# Patient Record
Sex: Female | Born: 1985 | Race: Black or African American | Hispanic: No | Marital: Single | Smoking: Current every day smoker
Health system: Southern US, Community
[De-identification: ages and names within clinical notes are randomized; demographics above are authoritative.]

## PROBLEM LIST (undated history)

## (undated) DIAGNOSIS — N289 Disorder of kidney and ureter, unspecified: Secondary | ICD-10-CM

## (undated) DIAGNOSIS — F419 Anxiety disorder, unspecified: Secondary | ICD-10-CM

## (undated) DIAGNOSIS — J45909 Unspecified asthma, uncomplicated: Secondary | ICD-10-CM

## (undated) DIAGNOSIS — A6009 Herpesviral infection of other urogenital tract: Secondary | ICD-10-CM

## (undated) DIAGNOSIS — N390 Urinary tract infection, site not specified: Secondary | ICD-10-CM

## (undated) DIAGNOSIS — F32A Depression, unspecified: Secondary | ICD-10-CM

## (undated) HISTORY — DX: Depression, unspecified: F32.A

## (undated) HISTORY — DX: Herpesviral infection of other urogenital tract: A60.09

## (undated) HISTORY — DX: Anxiety disorder, unspecified: F41.9

## (undated) HISTORY — PX: WISDOM TOOTH EXTRACTION: SHX21

---

## 2004-07-22 ENCOUNTER — Emergency Department: Payer: Self-pay | Admitting: Unknown Physician Specialty

## 2005-09-06 ENCOUNTER — Emergency Department: Payer: Self-pay | Admitting: Emergency Medicine

## 2006-09-23 ENCOUNTER — Emergency Department: Payer: Self-pay | Admitting: Emergency Medicine

## 2006-11-11 ENCOUNTER — Emergency Department: Payer: Self-pay | Admitting: Emergency Medicine

## 2006-11-11 ENCOUNTER — Other Ambulatory Visit: Payer: Self-pay

## 2006-11-22 ENCOUNTER — Emergency Department: Payer: Self-pay | Admitting: Emergency Medicine

## 2007-08-13 ENCOUNTER — Emergency Department: Payer: Self-pay | Admitting: Emergency Medicine

## 2008-10-09 ENCOUNTER — Emergency Department: Payer: Self-pay | Admitting: Emergency Medicine

## 2008-12-12 ENCOUNTER — Emergency Department: Payer: Self-pay | Admitting: Emergency Medicine

## 2008-12-16 ENCOUNTER — Emergency Department: Payer: Self-pay | Admitting: Emergency Medicine

## 2009-01-09 ENCOUNTER — Emergency Department: Payer: Self-pay | Admitting: Emergency Medicine

## 2009-03-04 ENCOUNTER — Emergency Department: Payer: Self-pay | Admitting: Emergency Medicine

## 2009-03-06 ENCOUNTER — Emergency Department: Payer: Self-pay | Admitting: Internal Medicine

## 2010-05-31 ENCOUNTER — Emergency Department: Payer: Self-pay | Admitting: Emergency Medicine

## 2011-01-11 ENCOUNTER — Emergency Department: Payer: Self-pay | Admitting: Internal Medicine

## 2011-11-30 ENCOUNTER — Emergency Department: Payer: Self-pay | Admitting: Emergency Medicine

## 2011-11-30 LAB — CBC
MCH: 32.8 pg (ref 26.0–34.0)
MCHC: 34.3 g/dL (ref 32.0–36.0)
Platelet: 140 10*3/uL — ABNORMAL LOW (ref 150–440)
RBC: 4.31 10*6/uL (ref 3.80–5.20)
RDW: 12.7 % (ref 11.5–14.5)
WBC: 7.2 10*3/uL (ref 3.6–11.0)

## 2011-11-30 LAB — URINALYSIS, COMPLETE
Nitrite: POSITIVE
Specific Gravity: 1.025 (ref 1.003–1.030)
WBC UR: 9 /HPF (ref 0–5)

## 2011-11-30 LAB — HCG, QUANTITATIVE, PREGNANCY: Beta Hcg, Quant.: 93854 m[IU]/mL — ABNORMAL HIGH

## 2011-11-30 LAB — WET PREP, GENITAL

## 2011-12-09 ENCOUNTER — Ambulatory Visit: Payer: Self-pay | Admitting: Family Medicine

## 2011-12-24 ENCOUNTER — Encounter: Payer: Self-pay | Admitting: Obstetrics & Gynecology

## 2012-02-04 ENCOUNTER — Encounter: Payer: Self-pay | Admitting: Obstetrics and Gynecology

## 2012-04-29 ENCOUNTER — Emergency Department: Payer: Self-pay | Admitting: Emergency Medicine

## 2012-04-29 LAB — URINALYSIS, COMPLETE
Bacteria: NONE SEEN
Bilirubin,UR: NEGATIVE
Nitrite: NEGATIVE
Ph: 7 (ref 4.5–8.0)
Protein: 30
RBC,UR: NONE SEEN /HPF (ref 0–5)
Specific Gravity: 1.023 (ref 1.003–1.030)
WBC UR: 2 /HPF (ref 0–5)

## 2012-06-22 ENCOUNTER — Observation Stay: Payer: Self-pay | Admitting: Obstetrics and Gynecology

## 2012-06-23 ENCOUNTER — Inpatient Hospital Stay: Payer: Self-pay

## 2012-06-23 LAB — CBC WITH DIFFERENTIAL/PLATELET
Basophil #: 0 10*3/uL (ref 0.0–0.1)
Basophil %: 0.3 %
Eosinophil #: 0 10*3/uL (ref 0.0–0.7)
HCT: 38.7 % (ref 35.0–47.0)
HGB: 13.3 g/dL (ref 12.0–16.0)
Lymphocyte %: 4.5 %
MCH: 32.3 pg (ref 26.0–34.0)
MCHC: 34.3 g/dL (ref 32.0–36.0)
Monocyte #: 0.5 x10 3/mm (ref 0.2–0.9)
Neutrophil #: 16.1 10*3/uL — ABNORMAL HIGH (ref 1.4–6.5)
Platelet: 128 10*3/uL — ABNORMAL LOW (ref 150–440)
RDW: 13.3 % (ref 11.5–14.5)
WBC: 17.4 10*3/uL — ABNORMAL HIGH (ref 3.6–11.0)

## 2012-06-23 LAB — DRUG SCREEN, URINE
Amphetamines, Ur Screen: NEGATIVE (ref ?–1000)
Benzodiazepine, Ur Scrn: NEGATIVE (ref ?–200)
Cocaine Metabolite,Ur ~~LOC~~: NEGATIVE (ref ?–300)
Methadone, Ur Screen: NEGATIVE (ref ?–300)
Opiate, Ur Screen: NEGATIVE (ref ?–300)
Phencyclidine (PCP) Ur S: NEGATIVE (ref ?–25)
Tricyclic, Ur Screen: NEGATIVE (ref ?–1000)

## 2012-07-08 ENCOUNTER — Ambulatory Visit: Payer: Self-pay | Admitting: Obstetrics and Gynecology

## 2012-07-08 LAB — CBC
HCT: 37.6 % (ref 35.0–47.0)
HGB: 12.4 g/dL (ref 12.0–16.0)
MCHC: 33 g/dL (ref 32.0–36.0)
MCV: 95 fL (ref 80–100)
Platelet: 281 10*3/uL (ref 150–440)
WBC: 9.6 10*3/uL (ref 3.6–11.0)

## 2012-07-08 LAB — COMPREHENSIVE METABOLIC PANEL
Anion Gap: 7 (ref 7–16)
Calcium, Total: 9 mg/dL (ref 8.5–10.1)
Chloride: 112 mmol/L — ABNORMAL HIGH (ref 98–107)
Co2: 23 mmol/L (ref 21–32)
Creatinine: 0.77 mg/dL (ref 0.60–1.30)
EGFR (African American): >60
EGFR (Non-African Amer.): >60
Glucose: 95 mg/dL (ref 65–99)
Potassium: 3.5 mmol/L (ref 3.5–5.1)
SGOT(AST): 20 U/L (ref 15–37)

## 2012-07-08 LAB — URINALYSIS, COMPLETE
Bilirubin,UR: NEGATIVE
Glucose,UR: NEGATIVE mg/dL (ref 0–75)
Leukocyte Esterase: NEGATIVE
Nitrite: NEGATIVE
Protein: NEGATIVE
RBC,UR: 160 /HPF (ref 0–5)
WBC UR: 37 /HPF (ref 0–5)

## 2012-07-08 LAB — HCG, QUANTITATIVE, PREGNANCY: Beta Hcg, Quant.: 10 m[IU]/mL — ABNORMAL HIGH

## 2012-07-11 LAB — PATHOLOGY REPORT

## 2014-03-31 ENCOUNTER — Emergency Department: Payer: Self-pay | Admitting: Emergency Medicine

## 2014-06-18 ENCOUNTER — Emergency Department: Payer: Self-pay | Admitting: Emergency Medicine

## 2014-06-18 LAB — CBC
HCT: 43.3 % (ref 35.0–47.0)
HGB: 14.1 g/dL (ref 12.0–16.0)
MCH: 31.5 pg (ref 26.0–34.0)
MCHC: 32.7 g/dL (ref 32.0–36.0)
MCV: 96 fL (ref 80–100)
Platelet: 145 10*3/uL — ABNORMAL LOW (ref 150–440)
RBC: 4.49 10*6/uL (ref 3.80–5.20)
RDW: 13.7 % (ref 11.5–14.5)
WBC: 6.5 10*3/uL (ref 3.6–11.0)

## 2014-06-18 LAB — BASIC METABOLIC PANEL
ANION GAP: 4 — AB (ref 7–16)
BUN: 7 mg/dL (ref 7–18)
CREATININE: 0.76 mg/dL (ref 0.60–1.30)
Calcium, Total: 9.1 mg/dL (ref 8.5–10.1)
Chloride: 106 mmol/L (ref 98–107)
Co2: 28 mmol/L (ref 21–32)
EGFR (African American): >60
EGFR (Non-African Amer.): >60
Glucose: 96 mg/dL (ref 65–99)
Osmolality: 274 (ref 275–301)
Potassium: 3.9 mmol/L (ref 3.5–5.1)
SODIUM: 138 mmol/L (ref 136–145)

## 2014-06-18 LAB — PROTIME-INR
INR: 1
Prothrombin Time: 12.6 secs (ref 11.5–14.7)

## 2014-09-17 ENCOUNTER — Emergency Department: Payer: Self-pay | Admitting: Emergency Medicine

## 2014-11-27 NOTE — H&P (Signed)
L&D Evaluation:  History:   HPI 29 year old G3P0 presented to L&D early this AM with c/o ctx's at 37 weeks 5 days. EDD 07/09/12, PNC at [facility] (transfer from [facility] at 19 weeks), notable for substance abuse (tobacco, cocaine (early preg), and MJ- pos multiple times), depression, and trich during pregnancy (treated).  Labs: O Pos, GBS Neg Tdap rec'd 05/26/12 When pt presented early this AM she was 2 cm (around 1 am) then was checked at 3 and 5 am with no cervical change other than effacement. At around 0830 the RN checked the pt because she was feeling pressure and she was complete.    Presents with contractions    Patient's Medical History No Chronic Illness    Patient's Surgical History none    Allergies NKDA    Social History tobacco  EtOH  drugs  hx MJ throughout preg, cocaine early preg    Family History Non-Contributory   ROS:   ROS see HPI   Exam:   Vital Signs stable    General no apparent distress    Mental Status clear    Chest nl effort    Abdomen gravid, tender with contractions    Estimated Fetal Weight Average for gestational age    Back no CVAT    Edema no edema    Pelvic no external lesions, complete and pushing    FHT normal rate with no decels    Ucx regular   Impression:   Impression active labor   Plan:   Plan EFM/NST, anticipate SVD   Electronic Signatures: Shella MaximPutnam, Marja Adderley (CNM)  (Signed 05-Dec-13 09:36)  Authored: L&D Evaluation   Last Updated: 05-Dec-13 09:36 by Shella MaximPutnam, Jin Capote (CNM)

## 2015-02-19 ENCOUNTER — Ambulatory Visit: Payer: Medicaid Other | Admitting: Podiatry

## 2015-03-12 ENCOUNTER — Ambulatory Visit (INDEPENDENT_AMBULATORY_CARE_PROVIDER_SITE_OTHER): Payer: Medicaid Other | Admitting: Podiatry

## 2015-03-12 ENCOUNTER — Ambulatory Visit (INDEPENDENT_AMBULATORY_CARE_PROVIDER_SITE_OTHER): Payer: Medicaid Other

## 2015-03-12 ENCOUNTER — Encounter: Payer: Self-pay | Admitting: Podiatry

## 2015-03-12 VITALS — BP 105/71 | HR 77 | Resp 18

## 2015-03-12 DIAGNOSIS — R52 Pain, unspecified: Secondary | ICD-10-CM | POA: Diagnosis not present

## 2015-03-12 DIAGNOSIS — M2012 Hallux valgus (acquired), left foot: Secondary | ICD-10-CM | POA: Diagnosis not present

## 2015-03-12 NOTE — Patient Instructions (Signed)
Pre-Operative Instructions  Congratulations, you have decided to take an important step to improving your quality of life.  You can be assured that the doctors of [facility] will be with you every step of the way.  1. Plan to be at the surgery center/hospital at least 1 (one) hour prior to your scheduled time unless otherwise directed by the surgical center/hospital staff.  You must have a responsible adult accompany you, remain during the surgery and drive you home.  Make sure you have directions to the surgical center/hospital and know how to get there on time. 2. For hospital based surgery you will need to obtain a history and physical form from your family physician within 1 month prior to the date of surgery- we will give you a form for you primary physician.  3. We make every effort to accommodate the date you request for surgery.  There are however, times where surgery dates or times have to be moved.  We will contact you as soon as possible if a change in schedule is required.   4. No Aspirin/Ibuprofen for one week before surgery.  If you are on aspirin, any non-steroidal anti-inflammatory medications (Mobic, Aleve, Ibuprofen) you should stop taking it 7 days prior to your surgery.  You make take Tylenol  For pain prior to surgery.  5. Medications- If you are taking daily heart and blood pressure medications, seizure, reflux, allergy, asthma, anxiety, pain or diabetes medications, make sure the surgery center/hospital is aware before the day of surgery so they may notify you which medications to take or avoid the day of surgery. 6. No food or drink after midnight the night before surgery unless directed otherwise by surgical center/hospital staff. 7. No alcoholic beverages 24 hours prior to surgery.  No smoking 24 hours prior to or 24 hours after surgery. 8. Wear loose pants or shorts- loose enough to fit over bandages, boots, and casts. 9. No slip on shoes, sneakers are best. 10. Bring  your boot with you to the surgery center/hospital.  Also bring crutches or a walker if your physician has prescribed it for you.  If you do not have this equipment, it will be provided for you after surgery. 11. If you have not been contracted by the surgery center/hospital by the day before your surgery, call to confirm the date and time of your surgery. 12. Leave-time from work may vary depending on the type of surgery you have.  Appropriate arrangements should be made prior to surgery with your employer. 13. Prescriptions will be provided immediately following surgery by your doctor.  Have these filled as soon as possible after surgery and take the medication as directed. 14. Remove nail polish on the operative foot. 15. Wash the night before surgery.  The night before surgery wash the foot and leg well with the antibacterial soap provided and water paying special attention to beneath the toenails and in between the toes.  Rinse thoroughly with water and dry well with a towel.  Perform this wash unless told not to do so by your physician.  Enclosed: 1 Ice pack (please put in freezer the night before surgery)   1 Hibiclens skin cleaner   Pre-op Instructions  If you have any questions regarding the instructions, do not hesitate to call our office.  [place]: [address] [place], [place] [address] [phone]  [place]: [address] [place], [place] [address] [phone]  [place]: [address]  [place],  [address] [phone]  Dr. Richard   Tuchman DPM, Dr. Norman Regal DPM Dr. Richard Sikora DPM, Dr. M. Todd Hyatt DPM, Dr. Kathryn Egerton DPM 

## 2015-03-12 NOTE — Progress Notes (Signed)
   Subjective:    Patient ID: Peggy Landry, female    DOB: 1985-12-08, 29 y.o.   MRN: [id number]  HPI   29 year old female presents the office with signs of left foot bunion which is been ongoing the last several years but has been progressive and becoming more painful over the last year. She states that she has pain overlying the bump on the side of her foot protective pressure in shoe gear. She states the areas irritated becomes red. She has tried shoe changes as well as padding without any relief. She is inquiring about any further treatment.  She does  get some occasional numbness and tingling to the side of her toe as well. No other complaints at this time.  Review of Systems  All other systems reviewed and are negative.      Objective:   Physical Exam AAO x3, NAD DP/PT pulses palpable bilaterally, CRT less than 3 seconds Protective sensation intact with Simms Weinstein monofilament, vibratory sensation intact, Achilles tendon reflex intact There is a moderate HAV deformity present on the left foot with tenderness over the medial aspect of the first metatarsal head. There is slight irritation in this area from shoe gear. There is no pain or crepitation with first MTPJ range of motion and the range of motion appears to be intact. There is no hypermobility. No significant HAV deformity on the right side. No other areas of tenderness to bilateral lower extremities. MMT 5/5, ROM WNL.  No open lesions or pre-ulcerative lesions.  No overlying edema, erythema, increase in warmth to bilateral lower extremities.  No pain with calf compression, swelling, warmth, erythema bilaterally.      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   I discussed with her both conservative and surgical treatment options. At this time she states that  she is attended multiple conservative treatments without much relief of symptoms and she is requesting surgical intervention. --I discussed with her left Austin bunionectomy. The incision placement as well as the postoperative course was discussed with the patient. I discussed risks of the surgery which include, but not limited to, infection, bleeding, pain, swelling, need for further surgery, delayed or nonhealing, painful or ugly scar, numbness or sensation changes, over/under correction, recurrence, transfer lesions, further deformity, hardware failure, DVT/PE, loss of toe/foot. Patient understands these risks and wishes to proceed with surgery. The surgical consent was reviewed with the patient all 3 pages were signed. No promises or guarantees were given to the outcome of the procedure. All questions were answered to the best of my ability. Before the surgery the patient was encouraged to call the office if there is any further questions. The surgery will be performed at the [facility] on an outpatient basis.  Ovid Curd, DPM

## 2015-04-15 ENCOUNTER — Telehealth: Payer: Self-pay | Admitting: *Deleted

## 2015-04-15 NOTE — Telephone Encounter (Signed)
"  Peggy Landry canceled her surgery for Wednesday.  She said she wanted to wait until after the holidays."

## 2015-04-23 ENCOUNTER — Encounter: Payer: Self-pay | Admitting: Podiatry

## 2015-09-06 LAB — HM HIV SCREENING LAB: HM HIV Screening: NEGATIVE

## 2015-10-02 DIAGNOSIS — A6000 Herpesviral infection of urogenital system, unspecified: Secondary | ICD-10-CM | POA: Insufficient documentation

## 2016-07-29 ENCOUNTER — Encounter: Payer: Self-pay | Admitting: Emergency Medicine

## 2016-07-29 ENCOUNTER — Emergency Department
Admission: EM | Admit: 2016-07-29 | Discharge: 2016-07-29 | Disposition: A | Discharge status: Home / Self Care | Payer: Medicaid Other | Attending: Emergency Medicine | Admitting: Emergency Medicine

## 2016-07-29 DIAGNOSIS — S61011A Laceration without foreign body of right thumb without damage to nail, initial encounter: Secondary | ICD-10-CM

## 2016-07-29 DIAGNOSIS — F17200 Nicotine dependence, unspecified, uncomplicated: Secondary | ICD-10-CM | POA: Insufficient documentation

## 2016-07-29 DIAGNOSIS — Y9389 Activity, other specified: Secondary | ICD-10-CM | POA: Diagnosis not present

## 2016-07-29 DIAGNOSIS — Y999 Unspecified external cause status: Secondary | ICD-10-CM | POA: Insufficient documentation

## 2016-07-29 DIAGNOSIS — Y92009 Unspecified place in unspecified non-institutional (private) residence as the place of occurrence of the external cause: Secondary | ICD-10-CM | POA: Insufficient documentation

## 2016-07-29 DIAGNOSIS — W25XXXA Contact with sharp glass, initial encounter: Secondary | ICD-10-CM | POA: Insufficient documentation

## 2016-07-29 NOTE — ED Provider Notes (Signed)
[facility] Emergency Department Provider Note  ____________________________________________  Time seen: Approximately 10:58 AM  I have reviewed the triage vital signs and the nursing notes.   HISTORY  Chief Complaint Laceration    HPI Peggy Landry is a 31 y.o. female, NAD,  presents to the emergency department for evaluation of a laceration to the right thumb. Injury occurred at approximately 11 PM last night as patient was knocking on a glass door with keys in her hand. The window and the door broke but was not shattered, and patient's hand was cut by the glass. The wound began to bleed but stopped when she cleansed it with water and applied pressure to the area. Patient denies any numbness, weakness, pain, or tingling about the hand or fingers. Has noted no continued bleeding. Denies any redness, swelling or skin sores. No oozing or weeping. Patient received the Tetanus vaccine 4 years ago when she was pregnant with her son.    History reviewed. No pertinent past medical history.  There are no active problems to display for this patient.   History reviewed. No pertinent surgical history.  Prior to Admission medications   Not on File    Allergies Patient has no known allergies.  History reviewed. No pertinent family history.  Social History Social History  Substance Use Topics  . Smoking status: Current Every Day Smoker  . Smokeless tobacco: Never Used  . Alcohol use 0.0 oz/week     Review of Systems  Constitutional: No fever/chills Musculoskeletal: Negative For hand, fingers or wrist pain. Skin: Positive laceration to right thumb without bleeding, oozing, weeping. No surrounding redness or swelling. Neurological: Negative for numbness, wheeze, tingling.   ____________________________________________   PHYSICAL EXAM:  VITAL SIGNS: ED Triage Vitals  Enc Vitals Group     BP 07/29/16 0842 114/75     Pulse Rate 07/29/16 0842 (!) 108      Resp 07/29/16 0842 18     Temp 07/29/16 0842 98.6 F (37 C)     Temp Source 07/29/16 0842 Oral     SpO2 07/29/16 0842 100 %     Weight 07/29/16 0843 137 lb (62.1 kg)     Height 07/29/16 0843 5\' 6"  (1.676 m)     Head Circumference --      Peak Flow --      Pain Score 07/29/16 0843 2     Pain Loc --      Pain Edu? --      Excl. in GC? --      Constitutional: Alert and oriented. Well appearing and in no acute distress. Eyes: Conjunctivae are normal.  Head: Atraumatic. Cardiovascular: Good peripheral circulation with 2+ pulses noted in right upper extremity. Capillary refill is brisk in all digits of the right hand. Respiratory: Normal respiratory effort without tachypnea or retractions.  Musculoskeletal: Full range of motion of bilateral wrists, hands and fingers. Strength of the right thumb is 5 out of 5 and equal to the left. Neurologic:  Normal speech and language. No gross focal neurologic deficits are appreciated.  Skin:  Skin is warm and dry. No rash, redness, swelling, oozing, weeping, bleeding noted. 1 cm, linear, clean laceration noted between MCP and PIP of right first digit. Abrasion approximately 0.5 cm in diameter noted to medial right palm. Abrasion approximately 0.5 cm in diameter noted to lateral right wrist. No foreign bodies are palpated visualized without any of the wounds. Psychiatric: Mood and affect are normal. Speech and behavior are  normal. Patient exhibits appropriate insight and judgement.   ____________________________________________   LABS  None ____________________________________________  EKG  None ____________________________________________  RADIOLOGY  None ____________________________________________    PROCEDURES  Procedure(s) performed: None   .Marland KitchenLaceration Repair Date/Time: 07/29/2016 10:59 AM Performed by: Hope Pigeon Authorized by: Hope Pigeon   Consent:    Consent obtained:  Verbal   Consent given by:  Patient    Risks discussed:  Infection and poor cosmetic result   Alternatives discussed:  No treatment Anesthesia (see MAR for exact dosages):    Anesthesia method:  None Laceration details:    Location:  Finger   Finger location:  R thumb   Length (cm):  1   Depth (mm):  1 Repair type:    Repair type:  Simple Pre-procedure details:    Preparation:  Patient was prepped and draped in usual sterile fashion Exploration:    Hemostasis achieved with:  Direct pressure   Wound exploration: wound explored through full range of motion and entire depth of wound probed and visualized     Wound extent: areolar tissue violated     Wound extent: no fascia violation noted, no foreign bodies/material noted, no muscle damage noted, no nerve damage noted, no tendon damage noted, no underlying fracture noted and no vascular damage noted     Contaminated: no   Treatment:    Area cleansed with:  Hibiclens   Amount of cleaning:  Standard   Foreign body removal: no foreign bodies.   Skin repair:    Repair method:  Steri-Strips and tissue adhesive   Number of Steri-Strips:  1 Approximation:    Approximation:  Close   Vermilion border: well-aligned   Post-procedure details:    Dressing:  Non-adherent dressing   Patient tolerance of procedure:  Tolerated well, no immediate complications     Medications - No data to display   ____________________________________________   INITIAL IMPRESSION / ASSESSMENT AND PLAN / ED COURSE  Pertinent labs & imaging results that were available during my care of the patient were reviewed by me and considered in my medical decision making (see chart for details).  Clinical Course     Patient's diagnosis is consistent with Laceration of right thumb without foreign body and without damage to the nail. Patient will be discharged home with instructions to keep the area clean and dry over the next few days to allow healing. Patient is to follow up with her primary care  provider or [facility] in 48 hours for wound check as needed. Patient is given ED precautions to return to the ED for any worsening or new symptoms.    ____________________________________________  FINAL CLINICAL IMPRESSION(S) / ED DIAGNOSES  Final diagnoses:  Laceration of right thumb without foreign body without damage to nail, initial encounter      NEW MEDICATIONS STARTED DURING THIS VISIT:  There are no discharge medications for this patient.        Hope Pigeon, PA-C 07/29/16 1147    Charlynne Pander, MD 07/29/16 [phone]

## 2016-07-29 NOTE — Discharge Instructions (Signed)
Keep wound clean and dry for the first few days and then may cleanse per usual.

## 2016-07-29 NOTE — ED Triage Notes (Signed)
Pt to ed with c/o laceration to right hand thumb .  Pt states she cut it on glass last night at home.

## 2016-08-01 ENCOUNTER — Emergency Department
Admission: EM | Admit: 2016-08-01 | Discharge: 2016-08-01 | Disposition: A | Discharge status: Home / Self Care | Payer: Medicaid Other | Attending: Emergency Medicine | Admitting: Emergency Medicine

## 2016-08-01 ENCOUNTER — Encounter: Payer: Self-pay | Admitting: Emergency Medicine

## 2016-08-01 DIAGNOSIS — F17200 Nicotine dependence, unspecified, uncomplicated: Secondary | ICD-10-CM | POA: Insufficient documentation

## 2016-08-01 DIAGNOSIS — Y829 Unspecified medical devices associated with adverse incidents: Secondary | ICD-10-CM | POA: Insufficient documentation

## 2016-08-01 DIAGNOSIS — T8130XA Disruption of wound, unspecified, initial encounter: Secondary | ICD-10-CM | POA: Insufficient documentation

## 2016-08-01 NOTE — Discharge Instructions (Signed)
Follow-up with your primary care doctor if any continued problems. Watch for signs of infection such as drainage, redness, fever. Keep area clean and dry and allow Steri-Strips to fall off on their own.

## 2016-08-01 NOTE — ED Provider Notes (Signed)
[facility] Emergency Department Provider Note   ____________________________________________   First MD Initiated Contact with Patient 08/01/16 1245     (approximate)  I have reviewed the triage vital signs and the nursing notes.   HISTORY  Chief Complaint Laceration   HPI Peggy Landry is a 31 y.o. female is here with complaint of Dermabond coming off her thumb. Patient states that she was seen in the emergency room earlier this week where Dermabond was placed on her laceration. She states that this came off last evening. There is been no active bleeding. There is been no signs of infection such as drainage or fever.  Patient was seen in the emergency room on 07/29/2016.   History reviewed. No pertinent past medical history.  There are no active problems to display for this patient.   History reviewed. No pertinent surgical history.  Prior to Admission medications   Not on File    Allergies Patient has no known allergies.  No family history on file.  Social History Social History  Substance Use Topics  . Smoking status: Current Every Day Smoker  . Smokeless tobacco: Never Used  . Alcohol use 0.0 oz/week    Review of Systems Constitutional: No fever/chills Cardiovascular: Denies chest pain. Respiratory: Denies shortness of breath. Skin: Positive laceration right palm. Neurological: Negative for headaches, focal weakness or numbness.  10-point ROS otherwise negative.  ____________________________________________   PHYSICAL EXAM:  VITAL SIGNS: ED Triage Vitals [08/01/16 1135]  Enc Vitals Group     BP 102/74     Pulse Rate 92     Resp 18     Temp 98.3 F (36.8 C)     Temp Source Oral     SpO2 98 %     Weight      Height      Head Circumference      Peak Flow      Pain Score      Pain Loc      Pain Edu?      Excl. in GC?     Constitutional: Alert and oriented. Well appearing and in no acute distress. Eyes:  Conjunctivae are normal. PERRL. EOMI. Head: Atraumatic. Nose: No congestion/rhinnorhea. Neck: No stridor.   Cardiovascular: Normal rate, regular rhythm. Grossly normal heart sounds.  Good peripheral circulation. Respiratory: Normal respiratory effort.  No retractions. Lungs CTAB. Musculoskeletal: Right thumb range of motion is within normal limits without any difficulty. Neurologic:  Normal speech and language. No gross focal neurologic deficits are appreciated. No gait instability. Skin:  Skin is warm, dry. There is a superficial linear laceration at the base of the right thumb without drainage, erythema or pain. Psychiatric: Mood and affect are normal. Speech and behavior are normal.  ____________________________________________   LABS (all labs ordered are listed, but only abnormal results are displayed)  Labs Reviewed - No data to display  PROCEDURES  Procedure(s) performed: None  Procedures  Critical Care performed: No  ____________________________________________   INITIAL IMPRESSION / ASSESSMENT AND PLAN / ED COURSE  Pertinent labs & imaging results that were available during my care of the patient were reviewed by me and considered in my medical decision making (see chart for details).    Clinical Course    Area on the right thumb was cleaned and Steri-Strips were applied to the area. Patient was encouraged to watch for signs of infection such as redness, drainage, fever. Patient will follow-up with her primary care doctor if any signs  of infection. She is also instructed to keep clean and dry and allow the Steri-Strips to fall off on their own.  ____________________________________________   FINAL CLINICAL IMPRESSION(S) / ED DIAGNOSES  Final diagnoses:  Wound disruption, initial encounter  Right thumb.    NEW MEDICATIONS STARTED DURING THIS VISIT:  There are no discharge medications for this patient.    Note:  This document was prepared using Dragon  voice recognition software and may include unintentional dictation errors.    Tommi Rumps, PA-C 08/01/16 1623    Jeanmarie Plant, MD 08/03/16 [phone]

## 2016-08-01 NOTE — ED Notes (Signed)
See triage note states she was seen about 2-3 days ago for laceration  Had laceration glued at that time  But now the glue is coming off

## 2016-08-01 NOTE — ED Triage Notes (Signed)
Pt states the glue came off her thumb after being seen earlier this week. Nad.

## 2018-09-29 ENCOUNTER — Emergency Department
Admission: EM | Admit: 2018-09-29 | Discharge: 2018-09-29 | Disposition: A | Discharge status: Home / Self Care | Payer: Medicaid Other | Attending: Emergency Medicine | Admitting: Emergency Medicine

## 2018-09-29 ENCOUNTER — Emergency Department: Payer: Medicaid Other

## 2018-09-29 ENCOUNTER — Encounter: Payer: Self-pay | Admitting: Emergency Medicine

## 2018-09-29 ENCOUNTER — Other Ambulatory Visit: Payer: Self-pay

## 2018-09-29 DIAGNOSIS — F17210 Nicotine dependence, cigarettes, uncomplicated: Secondary | ICD-10-CM | POA: Diagnosis not present

## 2018-09-29 DIAGNOSIS — R05 Cough: Secondary | ICD-10-CM | POA: Diagnosis present

## 2018-09-29 DIAGNOSIS — J209 Acute bronchitis, unspecified: Secondary | ICD-10-CM | POA: Diagnosis not present

## 2018-09-29 MED ORDER — BENZONATATE 100 MG PO CAPS: 100.0000 mg | ORAL_CAPSULE | Freq: Three times a day (TID) | ORAL | 0 refills | Status: AC | PRN

## 2018-09-29 MED ORDER — PREDNISONE 50 MG PO TABS: ORAL_TABLET | ORAL | 0 refills | Status: DC

## 2018-09-29 NOTE — ED Provider Notes (Signed)
[facility] Emergency Department Provider Note  ____________________________________________  Time seen: Approximately 3:35 PM  I have reviewed the triage vital signs and the nursing notes.   HISTORY  Chief Complaint Cough and Nasal Congestion    HPI Peggy Landry is a 33 y.o. female presents to the emergency department with productive cough for clear sputum production for approximately 8 days.  Patient initially had rhinorrhea, nasal congestion, pharyngitis and body aches that have improved.  Patient also states that she initially had purulent sputum production that has also since improved.  No associated shortness of breath or fatigue.  Patient reports that she is primarily presenting to the emergency department as cough is disrupting her sleep at night.  She has tried Delsym and NyQuil at night but cough does not seem to be improving with aforementioned medications.  Patient denies a history of chronic respiratory issues and has not been admitted in the recent past.  No recent travel out of the country.  No other alleviating measures have been attempted.       History reviewed. No pertinent past medical history.  There are no active problems to display for this patient.   History reviewed. No pertinent surgical history.  Prior to Admission medications   Medication Sig Start Date End Date Taking? Authorizing Provider  benzonatate (TESSALON PERLES) 100 MG capsule Take 1 capsule (100 mg total) by mouth 3 (three) times daily as needed for up to 7 days for cough. 09/29/18 10/06/18  Orvil Feil, PA-C  predniSONE (DELTASONE) 50 MG tablet Take one 50 mg tablet once daily for the next five days. 09/29/18   Orvil Feil, PA-C    Allergies Patient has no known allergies.  No family history on file.  Social History Social History   Tobacco Use  . Smoking status: Current Every Day Smoker  . Smokeless tobacco: Never Used  Substance Use Topics  .  Alcohol use: Yes    Alcohol/week: 0.0 standard drinks  . Drug use: No     Review of Systems  Constitutional: No fever/chills Eyes: No visual changes. No discharge ENT: No upper respiratory complaints. Cardiovascular: no chest pain. Respiratory: Patient has productive cough No SOB. Gastrointestinal: No abdominal pain.  No nausea, no vomiting.  No diarrhea.  No constipation. Genitourinary: Negative for dysuria. No hematuria Musculoskeletal: Negative for musculoskeletal pain. Skin: Negative for rash, abrasions, lacerations, ecchymosis. Neurological: Negative for headaches, focal weakness or numbness.   ____________________________________________   PHYSICAL EXAM:  VITAL SIGNS: ED Triage Vitals  Enc Vitals Group     BP 09/29/18 1448 116/67     Pulse Rate 09/29/18 1448 91     Resp 09/29/18 1448 18     Temp 09/29/18 1448 98.6 F (37 C)     Temp Source 09/29/18 1448 Oral     SpO2 09/29/18 1448 99 %     Weight 09/29/18 1448 164 lb (74.4 kg)     Height 09/29/18 1448 5\' 6"  (1.676 m)     Head Circumference --      Peak Flow --      Pain Score 09/29/18 1450 0     Pain Loc --      Pain Edu? --      Excl. in GC? --      Constitutional: Alert and oriented. Well appearing and in no acute distress. Eyes: Conjunctivae are normal. PERRL. EOMI. Head: Atraumatic. ENT:      Ears: TMs are effused.  Nose: No congestion/rhinnorhea.      Mouth/Throat: Mucous membranes are moist.  Neck: No stridor.  No cervical spine tenderness to palpation. Hematological/Lymphatic/Immunilogical: No cervical lymphadenopathy.  Cardiovascular: Normal rate, regular rhythm. Normal S1 and S2.  Good peripheral circulation. Respiratory: Normal respiratory effort without tachypnea or retractions. Lungs CTAB. Good air entry to the bases with no decreased or absent breath sounds. Gastrointestinal: Bowel sounds 4 quadrants. Soft and nontender to palpation. No guarding or rigidity. No palpable masses. No  distention. No CVA tenderness. Musculoskeletal: Full range of motion to all extremities. No gross deformities appreciated. Neurologic:  Normal speech and language. No gross focal neurologic deficits are appreciated.  Skin:  Skin is warm, dry and intact. No rash noted. Psychiatric: Mood and affect are normal. Speech and behavior are normal. Patient exhibits appropriate insight and judgement.   ____________________________________________   LABS (all labs ordered are listed, but only abnormal results are displayed)  Labs Reviewed - No data to display ____________________________________________  EKG   ____________________________________________  RADIOLOGY I personally viewed and evaluated these images as part of my medical decision making, as well as reviewing the written report by the radiologist.  Dg Chest 2 View  Result Date: 09/29/2018 CLINICAL DATA:  Cough for 8 days. EXAM: CHEST - 2 VIEW COMPARISON:  09/17/2014 radiographs FINDINGS: The cardiomediastinal silhouette is unremarkable. There is no evidence of focal airspace disease, pulmonary edema, suspicious pulmonary nodule/mass, pleural effusion, or pneumothorax. No acute bony abnormalities are identified. IMPRESSION: No active cardiopulmonary disease. Electronically Signed   By: Harmon Pier M.D.   On: 09/29/2018 15:33    ____________________________________________    PROCEDURES  Procedure(s) performed:    Procedures    Medications - No data to display   ____________________________________________   INITIAL IMPRESSION / ASSESSMENT AND PLAN / ED COURSE  Pertinent labs & imaging results that were available during my care of the patient were reviewed by me and considered in my medical decision making (see chart for details).  Review of the [place] CSRS was performed in accordance of the [facility] prior to dispensing any controlled drugs.           Assessment and Plan:  Bronchitis Patient presents to the emergency  department with persistent cough for the past 8 days.  Patient denies fever, purulent sputum production or fatigue.  No opacities, infiltrates or consolidations were visualized on chest x-ray that would suggest pneumonia.  Patient was discharged with prednisone and Tessalon Perles.  She was advised to follow-up with primary care as needed.  All patient questions were answered.     ____________________________________________  FINAL CLINICAL IMPRESSION(S) / ED DIAGNOSES  Final diagnoses:  Acute bronchitis, unspecified organism      NEW MEDICATIONS STARTED DURING THIS VISIT:  ED Discharge Orders         Ordered    benzonatate (TESSALON PERLES) 100 MG capsule  3 times daily PRN     09/29/18 1617    predniSONE (DELTASONE) 50 MG tablet     09/29/18 1617              This chart was dictated using voice recognition software/Dragon. Despite best efforts to proofread, errors can occur which can change the meaning. Any change was purely unintentional.    Orvil Feil, PA-C 09/29/18 1746    Nita Sickle, MD 10/03/18 [phone]

## 2018-09-29 NOTE — ED Triage Notes (Signed)
Cough since Wednesday.

## 2018-09-29 NOTE — ED Notes (Signed)
See triage note   Presents with cough and runny which started last week   States cough is non prod     But is having nasal and chest congestion

## 2018-09-29 NOTE — ED Triage Notes (Signed)
Pt c/o cough and runny nose since last Wednesday with sneezing.

## 2019-08-16 ENCOUNTER — Ambulatory Visit: Payer: Medicaid Other | Admitting: Physician Assistant

## 2019-08-16 ENCOUNTER — Other Ambulatory Visit: Payer: Self-pay

## 2019-08-16 DIAGNOSIS — Z113 Encounter for screening for infections with a predominantly sexual mode of transmission: Secondary | ICD-10-CM | POA: Diagnosis not present

## 2019-08-16 LAB — PREGNANCY, URINE: Preg Test, Ur: NEGATIVE

## 2019-08-16 LAB — WET PREP FOR TRICH, YEAST, CLUE
Trichomonas Exam: NEGATIVE
Yeast Exam: NEGATIVE

## 2019-08-16 NOTE — Progress Notes (Signed)
Patient here for STD testing.Delmo Matty Brewer-Jensen, RN 

## 2019-08-16 NOTE — Progress Notes (Signed)
Wet mount reviewed, no treatment indicated. PT negative. Patient counseled that if symptoms continue, to stop taking boric acid suppositories for several days and return to clinic for recheck. Patient states understanding.Burt Knack, RN

## 2019-08-17 ENCOUNTER — Encounter: Payer: Self-pay | Admitting: Physician Assistant

## 2019-08-17 NOTE — Progress Notes (Signed)
[facility] Department STI clinic/screening visit  Subjective:  Peggy Landry is a 34 y.o. female being seen today for an STI screening visit. The patient reports they do have symptoms.  Patient reports that they may desire a pregnancy in the next year.   They reported they are not interested in discussing contraception today.  Patient's last menstrual period was 07/13/2019 (exact date).   Patient has the following medical conditions:   Patient Active Problem List   Diagnosis Date Noted  . 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 SEXUALLY TRANSMITTED DISEASE    HPI  Patient reports that she has had some a "moist" feeling and irritated feeling for a few days.  Reports that she has trouble with BV and started using boric acid suppositories a few days ago having used 3 in the last week and last put on in 2 days ago.  Denies other symptoms.  Last got a depo in September of 2020.  Would be ok if a pregnancy were to occur.  See flowsheet for further details and programmatic requirements.    The following portions of the patient's history were reviewed and updated as appropriate: allergies, current medications, past medical history, past social history, past surgical history and problem list.  Objective:  There were no vitals filed for this visit.  Physical Exam Constitutional:      General: She is not in acute distress.    Appearance: She is normal weight.  HENT:     Head: Normocephalic and atraumatic.     Comments: No nits, lice, or hair loss. No cervical, supraclavicular or axillary adenopathy.    Mouth/Throat:     Mouth: Mucous membranes are moist.     Pharynx: Oropharynx is clear. No oropharyngeal exudate or posterior oropharyngeal erythema.  Eyes:     Conjunctiva/sclera: Conjunctivae normal.  Pulmonary:     Effort: Pulmonary effort is normal.  Abdominal:     Palpations: Abdomen is soft. There is no mass.     Tenderness: There is  no abdominal tenderness. There is no guarding or rebound.  Genitourinary:    General: Normal vulva.     Rectum: Normal.     Comments: External genitalia/pubic area without nits, lice, edema, erythema, lesions and inguinal adenopathy. Vagina with normal mucosa and scant clear discharge. Cervix without visible lesions. Uterus firm, mobile, nt, no masses, no CMT, no adnexal tenderness or fullness. Musculoskeletal:     Cervical back: Neck supple. No tenderness.  Skin:    General: Skin is warm and dry.     Findings: No bruising, erythema, lesion or rash.  Neurological:     Mental Status: She is alert and oriented to person, place, and time.  Psychiatric:        Mood and Affect: Mood normal.        Behavior: Behavior normal.        Thought Content: Thought content normal.        Judgment: Judgment normal.      Assessment and Plan:  Peggy Landry is a 34 y.o. female presenting to the [facility] Department for STI screening  1. Screening for STD (sexually transmitted disease) Patient into clinic with symptoms.  Normal exam and wet mount today. Rec condoms with all sex. Await test results.  Counseled that RN will call if needs to RTC for treatment once results are back. - Pregnancy, urine - WET PREP FOR TRICH, YEAST, CLUE - Gonococcus  culture - Chlamydia/Gonorrhea [place] [facility] - HIV [place] [facility] - Syphilis Serology, [facility] [place] [facility]     No follow-ups on file.  No future appointments.  Jerene Dilling, PA

## 2019-08-21 LAB — GONOCOCCUS CULTURE

## 2019-08-24 ENCOUNTER — Encounter: Payer: Self-pay | Admitting: Emergency Medicine

## 2019-08-24 ENCOUNTER — Emergency Department
Admission: EM | Admit: 2019-08-24 | Discharge: 2019-08-24 | Disposition: A | Discharge status: Home / Self Care | Payer: Medicaid Other | Attending: Emergency Medicine | Admitting: Emergency Medicine

## 2019-08-24 ENCOUNTER — Other Ambulatory Visit: Payer: Self-pay

## 2019-08-24 ENCOUNTER — Emergency Department: Payer: Medicaid Other

## 2019-08-24 DIAGNOSIS — R0789 Other chest pain: Secondary | ICD-10-CM | POA: Insufficient documentation

## 2019-08-24 DIAGNOSIS — F17210 Nicotine dependence, cigarettes, uncomplicated: Secondary | ICD-10-CM | POA: Diagnosis not present

## 2019-08-24 DIAGNOSIS — R05 Cough: Secondary | ICD-10-CM | POA: Diagnosis not present

## 2019-08-24 DIAGNOSIS — R053 Chronic cough: Secondary | ICD-10-CM

## 2019-08-24 LAB — TROPONIN I (HIGH SENSITIVITY): Troponin I (High Sensitivity): <2 ng/L (ref <18)

## 2019-08-24 LAB — BASIC METABOLIC PANEL
Anion gap: 8 (ref 5–15)
BUN: 8 mg/dL (ref 6–20)
CO2: 29 mmol/L (ref 22–32)
Calcium: 9 mg/dL (ref 8.9–10.3)
Chloride: 102 mmol/L (ref 98–111)
Creatinine, Ser: 0.86 mg/dL (ref 0.44–1.00)
GFR calc Af Amer: >60 mL/min (ref >60)
GFR calc non Af Amer: >60 mL/min (ref >60)
Glucose, Bld: 110 mg/dL — ABNORMAL HIGH (ref 70–99)
Potassium: 3.5 mmol/L (ref 3.5–5.1)
Sodium: 139 mmol/L (ref 135–145)

## 2019-08-24 LAB — CBC
HCT: 41.8 % (ref 36.0–46.0)
Hemoglobin: 14.2 g/dL (ref 12.0–15.0)
MCH: 32.6 pg (ref 26.0–34.0)
MCHC: 34 g/dL (ref 30.0–36.0)
MCV: 95.9 fL (ref 80.0–100.0)
Platelets: 173 10*3/uL (ref 150–400)
RBC: 4.36 MIL/uL (ref 3.87–5.11)
RDW: 12.7 % (ref 11.5–15.5)
WBC: 7.2 10*3/uL (ref 4.0–10.5)
nRBC: 0 % (ref 0.0–0.2)

## 2019-08-24 MED ORDER — OMEPRAZOLE 40 MG PO CPDR: 40.0000 mg | DELAYED_RELEASE_CAPSULE | Freq: Every day | ORAL | 2 refills | Status: DC

## 2019-08-24 MED ORDER — ALBUTEROL SULFATE HFA 108 (90 BASE) MCG/ACT IN AERS: 2.0000 | INHALATION_SPRAY | Freq: Four times a day (QID) | RESPIRATORY_TRACT | 1 refills | Status: AC | PRN

## 2019-08-24 NOTE — ED Notes (Signed)
See triage note  Presents with chest pain which has been intermittent for about 1 year  States she has been worked up by cardiologist   Also has had cough for several weeks

## 2019-08-24 NOTE — ED Provider Notes (Signed)
[facility] Emergency Department Provider Note  ____________________________________________   First MD Initiated Contact with Patient 08/24/19 1444     (approximate)  I have reviewed the triage vital signs and the nursing notes.   HISTORY  Chief Complaint Chest Pain and Cough    HPI Peggy Landry is a 34 y.o. female presents to the emergency department complaining of chest pain which has been intermittent for 1 year.  She has been worked up by cardiologist which had negative results.  States her chest feels tight at times.  She states she is been smoking more recently and thinks this is because more the chest tightness.  States she has had a chronic cough for over a year.  More so at night.  Her partner wanted her to come get it checked.  She denies reflux symptoms.  She denies any history of sarcoid.  She denies chest pain at this time.    History reviewed. No pertinent past medical history.  Patient Active Problem List   Diagnosis Date Noted  . 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 No pertinent surgical history.  Prior to Admission medications   Medication Sig Start Date End Date Taking? Authorizing Provider  albuterol (VENTOLIN HFA) 108 (90 Base) MCG/ACT inhaler Inhale 2 puffs into the lungs every 6 (six) hours as needed for wheezing or shortness of breath. 08/24/19   Peggy Landry, Roselyn Bering, PA-C  omeprazole (PRILOSEC) 40 MG capsule Take 1 capsule (40 mg total) by mouth daily. 08/24/19   Peggy Ghee, PA-C    Allergies Patient has no known allergies.  No family history on file.  Social History Social History   Tobacco Use  . Smoking status: Current Every Day Smoker    Packs/day: 1.00    Types: Cigarettes  . Smokeless tobacco: Never Used  . Tobacco comment: Quitline info given and refer to PCP for meds.  Substance Use Topics  . Alcohol use: Yes    Alcohol/week: 0.0 standard drinks  . Drug use: No    Review of  Systems  Constitutional: No fever/chills Eyes: No visual changes. ENT: No sore throat. Respiratory: Chronic cough cough, chest tightness Cardiovascular: Denies chest pain Gastrointestinal: Denies abdominal pain Genitourinary: Negative for dysuria. Musculoskeletal: Negative for back pain. Skin: Negative for rash. Psychiatric: no mood changes,     ____________________________________________   PHYSICAL EXAM:  VITAL SIGNS: ED Triage Vitals [08/24/19 1315]  Enc Vitals Group     BP 106/63     Pulse Rate 86     Resp 16     Temp 98.5 F (36.9 C)     Temp Source Oral     SpO2 98 %     Weight 145 lb (65.8 kg)     Height 5\' 7"  (1.702 m)     Head Circumference      Peak Flow      Pain Score 7     Pain Loc      Pain Edu?      Excl. in GC?     Constitutional: Alert and oriented. Well appearing and in no acute distress. Eyes: Conjunctivae are normal.  Head: Atraumatic. Nose: No congestion/rhinnorhea. Mouth/Throat: Mucous membranes are moist.   Neck:  supple no lymphadenopathy noted Cardiovascular: Normal rate, regular rhythm. Heart sounds are normal Respiratory: Normal respiratory effort.  No retractions, lungs c t a  GU: deferred Musculoskeletal: FROM all extremities, warm and well perfused Neurologic:  Normal speech and language.  Skin:  Skin is warm, dry and intact. No rash noted. Psychiatric: Mood and affect are normal. Speech and behavior are normal.  ____________________________________________   LABS (all labs ordered are listed, but only abnormal results are displayed)  Labs Reviewed  BASIC METABOLIC PANEL - Abnormal; Notable for the following components:      Result Value   Glucose, Bld 110 (*)    All other components within normal limits  CBC  POC URINE PREG, ED  TROPONIN I (HIGH SENSITIVITY)   ____________________________________________   ____________________________________________  RADIOLOGY  Chest x-ray is  normal  ____________________________________________   PROCEDURES  Procedure(s) performed: No  Procedures    ____________________________________________   INITIAL IMPRESSION / ASSESSMENT AND PLAN / ED COURSE  Pertinent labs & imaging results that were available during my care of the patient were reviewed by me and considered in my medical decision making (see chart for details).   Patient is a 34 year old female presents emergency department with history of cough and chest tightness for 1 year.  Has worsened in the past 6 months due to her increase in smoking.  See HPI  Patient appears very well.  No distress at all.  Exam is unremarkable  DDx: Chronic bronchitis, COPD, silent reflux  EKG shows normal sinus rhythm Basic metabolic panel is normal, CBC is normal, troponin negative, Chest x-ray is normal  Explained findings to the patient.  Explained to her that this could be silent reflux alone which is stable regular chronic bronchitis.  She is to follow-up with pulmonology.  Is given phone number to call.  She was given a prescription for Prilosec and an albuterol inhaler.  Return emergency department worsening.  States she understands will comply.  Is given a work note for today and discharged in stable condition.  Do not feel this is Covid.  She has had no fever or chills.  This cough has been ongoing for 1 year   Peggy Landry was evaluated in Emergency Department on 08/24/2019 for the symptoms described in the history of present illness. She was evaluated in the context of the global COVID-19 pandemic, which necessitated consideration that the patient might be at risk for infection with the SARS-CoV-2 virus that causes COVID-19. Institutional protocols and algorithms that pertain to the evaluation of patients at risk for COVID-19 are in a state of rapid change based on information released by regulatory bodies including the CDC and federal and state organizations. These policies  and algorithms were followed during the patient's care in the ED.   As part of my medical decision making, I reviewed the following data within the [place] notes reviewed and incorporated, Labs reviewed see above, EKG interpreted NSR, Old chart reviewed, Radiograph reviewed chest x-ray is normal, Notes from prior ED visits and [place] Controlled Substance Database  ____________________________________________   FINAL CLINICAL IMPRESSION(S) / ED DIAGNOSES  Final diagnoses:  Chronic cough      NEW MEDICATIONS STARTED DURING THIS VISIT:  Discharge Medication List as of 08/24/2019  2:54 PM    START taking these medications   Details  albuterol (VENTOLIN HFA) 108 (90 Base) MCG/ACT inhaler Inhale 2 puffs into the lungs every 6 (six) hours as needed for wheezing or shortness of breath., Starting Thu 08/24/2019, Normal    omeprazole (PRILOSEC) 40 MG capsule Take 1 capsule (40 mg total) by mouth daily., Starting Thu 08/24/2019, Normal         Note:  This document was prepared using Dragon voice recognition  software and may include unintentional dictation errors.    Peggy Ghee, PA-C 08/24/19 1610    Sharman Cheek, MD 08/25/19 Serena Croissant

## 2019-08-24 NOTE — ED Triage Notes (Signed)
Patient presents to the ED with occasional cough and intermittent chest pain x 2 months.  Patient states she is a heavy smoker.  States she has actually had symptoms for years, but it has increased over the past two months.  Patient describes chest pain as tightness.

## 2019-08-24 NOTE — Discharge Instructions (Addendum)
Follow-up with pulmonology.  Please call for an appointment.  I gave you the phone number to pulmonologist.  Change to either one.  Try taking the medication as prescribed.  Stop smoking.  If worsening return emergency department.

## 2019-09-12 ENCOUNTER — Telehealth: Payer: Self-pay

## 2019-09-12 NOTE — Telephone Encounter (Signed)
Confirmed appointment on 09/14/2019 and screened for covid. [facility] 

## 2019-09-14 ENCOUNTER — Encounter: Payer: Self-pay | Admitting: Internal Medicine

## 2019-09-14 ENCOUNTER — Other Ambulatory Visit: Payer: Self-pay

## 2019-09-14 ENCOUNTER — Ambulatory Visit: Payer: Medicaid Other | Admitting: Internal Medicine

## 2019-09-14 VITALS — BP 120/81 | HR 79 | Temp 98.2°F | Resp 16 | Ht 66.0 in | Wt 144.8 lb

## 2019-09-14 DIAGNOSIS — R0602 Shortness of breath: Secondary | ICD-10-CM

## 2019-09-14 DIAGNOSIS — R0789 Other chest pain: Secondary | ICD-10-CM

## 2019-09-14 NOTE — Progress Notes (Signed)
[facility] [place], [place] [address]  Pulmonary Sleep Medicine   Office Visit Note  Patient Name: Peggy Landry DOB: 01/30/1986 MRN [id number]  Date of Service: 09/14/2019  Complaints/HPI: Pt is here today to establish care.  She reports she went to the ER on 08/24/19 for sob, cough, and chest pains. She describes this chest pain as a tightness or squeezing pain mid-sternally. She first noticed this more than a year, but has become more often in the last 3 months.  She smokes 1 PPd of cigarettes for approximately 20 years.  She also smokes marijuana three times a week. She was given an albuterol inhaler    ROS  General: (-) fever, (-) chills, (-) night sweats, (-) weakness Skin: (-) rashes, (-) itching,. Eyes: (-) visual changes, (-) redness, (-) itching. Nose and Sinuses: (-) nasal stuffiness or itchiness, (-) postnasal drip, (-) nosebleeds, (-) sinus trouble. Mouth and Throat: (-) sore throat, (-) hoarseness. Neck: (-) swollen glands, (-) enlarged thyroid, (-) neck pain. Respiratory: - cough, (-) bloody sputum, - shortness of breath, - wheezing. Cardiovascular: - ankle swelling, (-) chest pain. Lymphatic: (-) lymph node enlargement. Neurologic: (-) numbness, (-) tingling. Psychiatric: (-) anxiety, (-) depression   Current Medication: Outpatient Encounter Medications as of 09/14/2019  Medication Sig  . albuterol (VENTOLIN HFA) 108 (90 Base) MCG/ACT inhaler Inhale 2 puffs into the lungs every 6 (six) hours as needed for wheezing or shortness of breath.  Marland Kitchen omeprazole (PRILOSEC) 40 MG capsule Take 1 capsule (40 mg total) by mouth daily.   No facility-administered encounter medications on file as of 09/14/2019.    Surgical History: History reviewed. No pertinent surgical history.  Medical History: History reviewed. No pertinent past medical history.  Family History: Family History  Problem Relation Age of Onset  . Hypertension Mother   .  Dementia Father   . COPD Father     Social History: Social History   Socioeconomic History  . Marital status: Single    Spouse name: Not on file  . Number of children: Not on file  . Years of education: Not on file  . Highest education level: Not on file  Occupational History  . Not on file  Tobacco Use  . Smoking status: Current Every Day Smoker    Packs/day: 1.00    Types: Cigarettes  . Smokeless tobacco: Never Used  . Tobacco comment: Quitline info given and refer to PCP for meds.  Substance and Sexual Activity  . Alcohol use: Yes    Alcohol/week: 0.0 standard drinks  . Drug use: Yes    Types: Marijuana    Comment: seldomly  . Sexual activity: Not on file  Other Topics Concern  . Not on file  Social History Narrative  . Not on file   Social Determinants of Health   Financial Resource Strain:   . Difficulty of Paying Living Expenses: Not on file  Food Insecurity:   . Worried About Charity fundraiser in the Last Year: Not on file  . Ran Out of Food in the Last Year: Not on file  Transportation Needs:   . Lack of Transportation (Medical): Not on file  . Lack of Transportation (Non-Medical): Not on file  Physical Activity:   . Days of Exercise per Week: Not on file  . Minutes of Exercise per Session: Not on file  Stress:   . Feeling of Stress : Not on file  Social Connections:   . Frequency of  Communication with Friends and Family: Not on file  . Frequency of Social Gatherings with Friends and Family: Not on file  . Attends Religious Services: Not on file  . Active Member of Clubs or Organizations: Not on file  . Attends Banker Meetings: Not on file  . Marital Status: Not on file  Intimate Partner Violence:   . Fear of Current or Ex-Partner: Not on file  . Emotionally Abused: Not on file  . Physically Abused: Not on file  . Sexually Abused: Not on file    Vital Signs: Blood pressure 120/81, pulse 79, temperature 98.2 F (36.8 C), resp.  rate 16, height 5\' 6"  (1.676 m), weight 144 lb 12.8 oz (65.7 kg), last menstrual period 08/21/2019, SpO2 98 %.  Examination: General Appearance: The patient is well-developed, well-nourished, and in no distress. Skin: Gross inspection of skin unremarkable. Head: normocephalic, no gross deformities. Eyes: no gross deformities noted. ENT: ears appear grossly normal no exudates. Neck: Supple. No thyromegaly. No LAD. Respiratory: clear bilateraly. Cardiovascular: Normal S1 and S2 without murmur or rub. Extremities: No cyanosis. pulses are equal. Neurologic: Alert and oriented. No involuntary movements.  LABS: Recent Results (from the past 2160 hour(s))  Gonococcus culture     Status: None   Collection Time: 08/16/19  3:30 PM   Specimen: Pharynx; Throat   PH  Result Value Ref Range   GC Culture Only Final report    Result 1 Comment     Comment: No Neisseria gonorrhoeae isolated.  Pregnancy, urine     Status: None   Collection Time: 08/16/19  4:26 PM  Result Value Ref Range   Preg Test, Ur Negative Negative  WET PREP FOR TRICH, YEAST, CLUE     Status: None   Collection Time: 08/16/19  4:27 PM  Result Value Ref Range   Trichomonas Exam Negative Negative   Yeast Exam Negative Negative   Clue Cell Exam Comment: Negative    Comment: NEG;AMINE NEG  Basic metabolic panel     Status: Abnormal   Collection Time: 08/24/19  1:22 PM  Result Value Ref Range   Sodium 139 135 - 145 mmol/L   Potassium 3.5 3.5 - 5.1 mmol/L   Chloride 102 98 - 111 mmol/L   CO2 29 22 - 32 mmol/L   Glucose, Bld 110 (H) 70 - 99 mg/dL   BUN 8 6 - 20 mg/dL   Creatinine, Ser 10/22/19 0.44 - 1.00 mg/dL   Calcium 9.0 8.9 - 4.76 mg/dL   GFR calc non Af Amer >60 >60 mL/min   GFR calc Af Amer >60 >60 mL/min   Anion gap 8 5 - 15    Comment: Performed at [facility], [address] [place], [place]  CBC     Status: None   Collection Time: 08/24/19  1:22 PM  Result Value Ref Range   WBC 7.2 4.0 -  10.5 K/uL   RBC 4.36 3.87 - 5.11 MIL/uL   Hemoglobin 14.2 12.0 - 15.0 g/dL   HCT 10/22/19 65.6 - 81.2 %   MCV 95.9 80.0 - 100.0 fL   MCH 32.6 26.0 - 34.0 pg   MCHC 34.0 30.0 - 36.0 g/dL   RDW 75.1 70.0 - 17.4 %   Platelets 173 150 - 400 K/uL   nRBC 0.0 0.0 - 0.2 %    Comment: Performed at [facility], [address] [place], [place]  Troponin I (High Sensitivity)     Status:  None   Collection Time: 08/24/19  1:22 PM  Result Value Ref Range   Troponin I (High Sensitivity) <2 <18 ng/L    Comment: (NOTE) Elevated high sensitivity troponin I (hsTnI) values and significant  changes across serial measurements may suggest ACS but many other  chronic and acute conditions are known to elevate hsTnI results.  Refer to the "Links" section for chest pain algorithms and additional  guidance. Performed at [facility], [address] [place], [place] [address]     Radiology: DG Chest 2 View  Result Date: 08/24/2019 CLINICAL DATA:  Chest pain EXAM: CHEST - 2 VIEW COMPARISON:  09/29/2018 FINDINGS: The heart size and mediastinal contours are within normal limits. Both lungs are clear. No pleural effusion or pneumothorax. The visualized skeletal structures are unremarkable. IMPRESSION: No acute process in the chest. Electronically Signed   By: Guadlupe Spanish M.D.   On: 08/24/2019 14:00    No results found.  DG Chest 2 View  Result Date: 08/24/2019 CLINICAL DATA:  Chest pain EXAM: CHEST - 2 VIEW COMPARISON:  09/29/2018 FINDINGS: The heart size and mediastinal contours are within normal limits. Both lungs are clear. No pleural effusion or pneumothorax. The visualized skeletal structures are unremarkable. IMPRESSION: No acute process in the chest. Electronically Signed   By: Guadlupe Spanish M.D.   On: 08/24/2019 14:00      Assessment and Plan: Patient Active Problem List   Diagnosis Date Noted  . 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 Feeling of chest tightness Will get CT  angio.  Suspicious for PE/ILD.  Will also schedule PFT.  Patient should use albuterol PRN as directed.  - CT Angio Chest W/Cm &/Or Wo Cm; Future - Pulmonary Function Test; Future  2. Shortness of breath Stable, continue present mgmt.  General Counseling: I have discussed the findings of the evaluation and examination with Cala Bradford.  I have also discussed any further diagnostic evaluation thatmay be needed or ordered today. Luellen verbalizes understanding of the findings of todays visit. We also reviewed her medications today and discussed drug interactions and side effects including but not limited excessive drowsiness and altered mental states. We also discussed that there is always a risk not just to her but also people around her. she has been encouraged to call the office with any questions or concerns that should arise related to todays visit.  No orders of the defined types were placed in this encounter.    Time spent:  25 This patient was seen by Blima Ledger AGNP-C in Collaboration with Dr. Freda Munro as a part of collaborative care agreement.   I have personally obtained a history, examined the patient, evaluated laboratory and imaging results, formulated the assessment and plan and placed orders.    Yevonne Pax, MD [facility] Pulmonary and Critical Care Sleep medicine

## 2019-09-18 ENCOUNTER — Telehealth: Payer: Self-pay

## 2019-09-18 NOTE — Telephone Encounter (Signed)
Confirmed appointment on 09/20/2019 and screened for covid. klh 

## 2019-09-20 ENCOUNTER — Ambulatory Visit: Payer: Medicaid Other | Admitting: Internal Medicine

## 2019-09-20 ENCOUNTER — Other Ambulatory Visit: Payer: Self-pay

## 2019-09-20 DIAGNOSIS — R0602 Shortness of breath: Secondary | ICD-10-CM

## 2019-09-20 LAB — PULMONARY FUNCTION TEST

## 2019-09-26 ENCOUNTER — Ambulatory Visit: Admission: RE | Admit: 2019-09-26 | Payer: Medicaid Other | Source: Ambulatory Visit

## 2019-09-27 ENCOUNTER — Ambulatory Visit: Payer: Medicaid Other | Admitting: Adult Health

## 2019-10-01 NOTE — Procedures (Signed)
[facility] [address] [place], [address]  DATE OF SERVICE: September 20, 2019  Complete Pulmonary Function Testing Interpretation:  FINDINGS:  Forced vital capacity is normal.  The FEV1 is normal.  FEV1 FVC ratio is normal.  Postbronchodilator no significant change in FEV1 clinical improvement may still occur in the absence of spirometric improvement.  Total lung capacity is normal residual volume is decreased residual in total lung capacity ratio is decreased FRC is decreased.  DLCO was mildly decreased however normal when corrected for alveolar volume.  IMPRESSION:  This pulmonary function study is within normal limits.  DLCO was mildly decreased but normal when corrected for alveolar volume clinical correlation is recommended  Yevonne Pax, MD [facility] Pulmonary Critical Care Medicine Sleep Medicine

## 2019-10-04 ENCOUNTER — Ambulatory Visit: Payer: Medicaid Other | Admitting: Adult Health

## 2019-10-06 ENCOUNTER — Telehealth: Payer: Self-pay

## 2019-10-06 NOTE — Telephone Encounter (Signed)
Called lmom informing patient of appointment on 10/10/2019. klh 

## 2019-10-10 ENCOUNTER — Ambulatory Visit: Payer: Medicaid Other | Admitting: Adult Health

## 2019-10-10 ENCOUNTER — Encounter: Payer: Self-pay | Admitting: Adult Health

## 2019-10-10 ENCOUNTER — Other Ambulatory Visit: Payer: Self-pay

## 2019-10-10 VITALS — BP 116/81 | HR 85 | Temp 97.5°F | Resp 16 | Ht 66.0 in | Wt 148.0 lb

## 2019-10-10 DIAGNOSIS — F17210 Nicotine dependence, cigarettes, uncomplicated: Secondary | ICD-10-CM | POA: Diagnosis not present

## 2019-10-10 DIAGNOSIS — R0789 Other chest pain: Secondary | ICD-10-CM

## 2019-10-10 DIAGNOSIS — R0602 Shortness of breath: Secondary | ICD-10-CM

## 2019-10-10 MED ORDER — CHANTIX STARTING MONTH PAK 0.5 MG X 11 & 1 MG X 42 PO TABS: ORAL_TABLET | ORAL | 0 refills | Status: DC

## 2019-10-10 NOTE — Progress Notes (Signed)
[facility] [place], [place] [address]  Pulmonary Sleep Medicine   Office Visit Note  Patient Name: Peggy Landry DOB: 1986/03/19 MRN [id number]  Date of Service: 10/10/2019  Complaints/HPI: Pt is here for follow up.  She reports overall she has been doing well. She reports a few episodes of chest tightness, mostly with exercise.  Her PFT was WNL, with mildly decreased DLCO.  She denies any new or worsening symptoms.   She would like to try Chantix to quit smoking at this time.   ROS  General: (-) fever, (-) chills, (-) night sweats, (-) weakness Skin: (-) rashes, (-) itching,. Eyes: (-) visual changes, (-) redness, (-) itching. Nose and Sinuses: (-) nasal stuffiness or itchiness, (-) postnasal drip, (-) nosebleeds, (-) sinus trouble. Mouth and Throat: (-) sore throat, (-) hoarseness. Neck: (-) swollen glands, (-) enlarged thyroid, (-) neck pain. Respiratory: - cough, (-) bloody sputum, - shortness of breath, - wheezing. Cardiovascular: - ankle swelling, (-) chest pain. Lymphatic: (-) lymph node enlargement. Neurologic: (-) numbness, (-) tingling. Psychiatric: (-) anxiety, (-) depression   Current Medication: Outpatient Encounter Medications as of 10/10/2019  Medication Sig  . albuterol (VENTOLIN HFA) 108 (90 Base) MCG/ACT inhaler Inhale 2 puffs into the lungs every 6 (six) hours as needed for wheezing or shortness of breath.  Marland Kitchen omeprazole (PRILOSEC) 40 MG capsule Take 1 capsule (40 mg total) by mouth daily.   No facility-administered encounter medications on file as of 10/10/2019.    Surgical History: History reviewed. No pertinent surgical history.  Medical History: History reviewed. No pertinent past medical history.  Family History: Family History  Problem Relation Age of Onset  . Hypertension Mother   . Dementia Father   . COPD Father     Social History: Social History   Socioeconomic History  . Marital status: Single     Spouse name: Not on file  . Number of children: Not on file  . Years of education: Not on file  . Highest education level: Not on file  Occupational History  . Not on file  Tobacco Use  . Smoking status: Current Every Day Smoker    Packs/day: 1.00    Types: Cigarettes  . Smokeless tobacco: Never Used  . Tobacco comment: Quitline info given and refer to PCP for meds.  Substance and Sexual Activity  . Alcohol use: Yes    Alcohol/week: 0.0 standard drinks  . Drug use: Yes    Types: Marijuana    Comment: seldomly  . Sexual activity: Not on file  Other Topics Concern  . Not on file  Social History Narrative  . Not on file   Social Determinants of Health   Financial Resource Strain:   . Difficulty of Paying Living Expenses:   Food Insecurity:   . Worried About Charity fundraiser in the Last Year:   . Arboriculturist in the Last Year:   Transportation Needs:   . Film/video editor (Medical):   Marland Kitchen Lack of Transportation (Non-Medical):   Physical Activity:   . Days of Exercise per Week:   . Minutes of Exercise per Session:   Stress:   . Feeling of Stress :   Social Connections:   . Frequency of Communication with Friends and Family:   . Frequency of Social Gatherings with Friends and Family:   . Attends Religious Services:   . Active Member of Clubs or Organizations:   . Attends Archivist Meetings:   .  Marital Status:   Intimate Partner Violence:   . Fear of Current or Ex-Partner:   . Emotionally Abused:   Marland Kitchen Physically Abused:   . Sexually Abused:     Vital Signs: Blood pressure 116/81, pulse 85, temperature (!) 97.5 F (36.4 C), resp. rate 16, height 5\' 6"  (1.676 m), weight 148 lb (67.1 kg), SpO2 100 %.  Examination: General Appearance: The patient is well-developed, well-nourished, and in no distress. Skin: Gross inspection of skin unremarkable. Head: normocephalic, no gross deformities. Eyes: no gross deformities noted. ENT: ears appear grossly  normal no exudates. Neck: Supple. No thyromegaly. No LAD. Respiratory: clear bilaterally. Cardiovascular: Normal S1 and S2 without murmur or rub. Extremities: No cyanosis. pulses are equal. Neurologic: Alert and oriented. No involuntary movements.  LABS: Recent Results (from the past 2160 hour(s))  Gonococcus culture     Status: None   Collection Time: 08/16/19  3:30 PM   Specimen: Pharynx; Throat   PH  Result Value Ref Range   GC Culture Only Final report    Result 1 Comment     Comment: No Neisseria gonorrhoeae isolated.  Pregnancy, urine     Status: None   Collection Time: 08/16/19  4:26 PM  Result Value Ref Range   Preg Test, Ur Negative Negative  WET PREP FOR TRICH, YEAST, CLUE     Status: None   Collection Time: 08/16/19  4:27 PM  Result Value Ref Range   Trichomonas Exam Negative Negative   Yeast Exam Negative Negative   Clue Cell Exam Comment: Negative    Comment: NEG;AMINE NEG  Basic metabolic panel     Status: Abnormal   Collection Time: 08/24/19  1:22 PM  Result Value Ref Range   Sodium 139 135 - 145 mmol/L   Potassium 3.5 3.5 - 5.1 mmol/L   Chloride 102 98 - 111 mmol/L   CO2 29 22 - 32 mmol/L   Glucose, Bld 110 (H) 70 - 99 mg/dL   BUN 8 6 - 20 mg/dL   Creatinine, Ser 10/22/19 0.44 - 1.00 mg/dL   Calcium 9.0 8.9 - 7.56 mg/dL   GFR calc non Af Amer >60 >60 mL/min   GFR calc Af Amer >60 >60 mL/min   Anion gap 8 5 - 15    Comment: Performed at [facility], [address] [place], [place]  CBC     Status: None   Collection Time: 08/24/19  1:22 PM  Result Value Ref Range   WBC 7.2 4.0 - 10.5 K/uL   RBC 4.36 3.87 - 5.11 MIL/uL   Hemoglobin 14.2 12.0 - 15.0 g/dL   HCT 10/22/19 84.1 - 66.0 %   MCV 95.9 80.0 - 100.0 fL   MCH 32.6 26.0 - 34.0 pg   MCHC 34.0 30.0 - 36.0 g/dL   RDW 63.0 16.0 - 10.9 %   Platelets 173 150 - 400 K/uL   nRBC 0.0 0.0 - 0.2 %    Comment: Performed at [facility], [address] [place], [place]   Troponin I (High Sensitivity)     Status: None   Collection Time: 08/24/19  1:22 PM  Result Value Ref Range   Troponin I (High Sensitivity) <2 <18 ng/L    Comment: (NOTE) Elevated high sensitivity troponin I (hsTnI) values and significant  changes across serial measurements may suggest ACS but many other  chronic and acute conditions are known to elevate hsTnI results.  Refer to the "Links" section for chest  pain algorithms and additional  guidance. Performed at [facility], [address], [place], [place] [address]     Radiology: DG Chest 2 View  Result Date: 08/24/2019 CLINICAL DATA:  Chest pain EXAM: CHEST - 2 VIEW COMPARISON:  09/29/2018 FINDINGS: The heart size and mediastinal contours are within normal limits. Both lungs are clear. No pleural effusion or pneumothorax. The visualized skeletal structures are unremarkable. IMPRESSION: No acute process in the chest. Electronically Signed   By: Guadlupe Spanish M.D.   On: 08/24/2019 14:00    No results found.  No results found.    Assessment and Plan: Patient Active Problem List   Diagnosis Date Noted  . 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 Cigarette nicotine dependence without complication Use chantix as directed - varenicline (CHANTIX STARTING MONTH PAK) 0.5 MG X 11 & 1 MG X 42 tablet; Take one 0.5 mg tablet by mouth once daily for 3 days, then increase to one 0.5 mg tablet twice daily for 4 days, then increase to one 1 mg tablet twice daily.  Dispense: 53 tablet; Refill: 0  2. Shortness of breath Use inhaler before exercise.  Follow up as discussed.   3. Feeling of chest tightness Intermittent, and less than before.  Continue to use inhaler as prescribed.  PT was unable to get Chest CTA due to insurance.    General Counseling: I have discussed the findings of the evaluation and examination with Cala Bradford.  I have also discussed any further diagnostic evaluation thatmay be needed or ordered today. Detria verbalizes  understanding of the findings of todays visit. We also reviewed her medications today and discussed drug interactions and side effects including but not limited excessive drowsiness and altered mental states. We also discussed that there is always a risk not just to her but also people around her. she has been encouraged to call the office with any questions or concerns that should arise related to todays visit.  No orders of the defined types were placed in this encounter.    Time spent: 25 This patient was seen by Blima Ledger AGNP-C in Collaboration with Dr. Freda Munro as a part of collaborative care agreement.   I have personally obtained a history, examined the patient, evaluated laboratory and imaging results, formulated the assessment and plan and placed orders.    Yevonne Pax, MD [facility] Pulmonary and Critical Care Sleep medicine

## 2019-11-02 ENCOUNTER — Telehealth: Payer: Self-pay

## 2019-11-02 NOTE — Telephone Encounter (Signed)
Called lmom informing patient of appointment on 11/06/2019.klh 

## 2019-11-06 ENCOUNTER — Ambulatory Visit: Payer: Medicaid Other

## 2019-11-06 ENCOUNTER — Ambulatory Visit: Payer: Medicaid Other | Admitting: Internal Medicine

## 2019-11-07 ENCOUNTER — Encounter: Payer: Self-pay | Admitting: Family Medicine

## 2019-11-07 ENCOUNTER — Ambulatory Visit (LOCAL_COMMUNITY_HEALTH_CENTER): Payer: Medicaid Other | Admitting: Family Medicine

## 2019-11-07 ENCOUNTER — Other Ambulatory Visit: Payer: Self-pay

## 2019-11-07 VITALS — BP 105/71 | Ht 66.0 in | Wt 146.6 lb

## 2019-11-07 DIAGNOSIS — F329 Major depressive disorder, single episode, unspecified: Secondary | ICD-10-CM | POA: Insufficient documentation

## 2019-11-07 DIAGNOSIS — Z30013 Encounter for initial prescription of injectable contraceptive: Secondary | ICD-10-CM | POA: Diagnosis not present

## 2019-11-07 DIAGNOSIS — Z3009 Encounter for other general counseling and advice on contraception: Secondary | ICD-10-CM

## 2019-11-07 DIAGNOSIS — J45909 Unspecified asthma, uncomplicated: Secondary | ICD-10-CM | POA: Insufficient documentation

## 2019-11-07 DIAGNOSIS — F32A Depression, unspecified: Secondary | ICD-10-CM | POA: Insufficient documentation

## 2019-11-07 DIAGNOSIS — Z113 Encounter for screening for infections with a predominantly sexual mode of transmission: Secondary | ICD-10-CM

## 2019-11-07 LAB — WET PREP FOR TRICH, YEAST, CLUE
Trichomonas Exam: NEGATIVE
Yeast Exam: NEGATIVE

## 2019-11-07 MED ORDER — MEDROXYPROGESTERONE ACETATE 150 MG/ML IM SUSP
150.0000 mg | INTRAMUSCULAR | Status: AC
  Administered 2019-11-07 – 2020-07-18 (×4): 150 mg via INTRAMUSCULAR

## 2019-11-07 NOTE — Progress Notes (Signed)
Family Planning Visit  Subjective:  Peggy Landry is a 34 y.o. being seen today for  Chief Complaint  Patient presents with  . 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  HPI  Patient reports she is here to restart Depo, last used this last year. Also would like STI screening, wondering if BV is back. Endorses occ foul odor, discharge.   Pt denies all of the following, which are contraindications to Depo use: Known breast cancer Pregnancy Also denies: Hypertension (CDC cat 2 if mild, cat 3 if severe) Severe cirrhosis, hepatocellular adenoma Diabetes with nephrosis or vascular complications Ischemic heart disease or multiple risk factors for atherosclerotic disease, and some forms of lupus Unexplained vaginal bleeding Pregnancy planned within the next year Long-term use of corticosteroid therapy in women with a history of, or risk factors for, nontraumatic (frailty) fractures.  Current use of aminoglutethimide (usually for the treatment of Cushing's syndrome) because aminoglutethimide may increase metabolism of progestins    Patient's last menstrual period was 11/01/2019 (exact date). Last sex: 2-3 wks ago BCM: no Pt desires EC? n/a  Last pap: unsure when though <2 yrs, at [facility]  Last breast exam: >2 yrs  Patient reports 2 partner(s) in last year. Do they desire STI screening (if no, why not)? Yes, but declines HIV and syphilis  Does the patient desire a pregnancy in the next year? No    33 y.o., Body mass index is 23.66 kg/m. - Is patient eligible for HA1C diabetes screening based on BMI and age >16?  no  Does the patient have a current or past history of drug use? yes No components found for: HCV  See flowsheet for other program required questions.   Health Maintenance Due  Topic Date Due  . COVID-19 Vaccine (1) Never done  . TETANUS/TDAP  Never done  . PAP SMEAR-Modifier  Never done    ROS  The following  portions of the patient's history were reviewed and updated as appropriate: allergies, current medications, past family history, past medical history, past social history, past surgical history and problem list. Problem list updated.  Objective:  BP 105/71   Ht 5\' 6"  (1.676 m)   Wt 146 lb 9.6 oz (66.5 kg)   LMP 11/01/2019 (Exact Date)   BMI 23.66 kg/m    Physical Exam Vitals and nursing note reviewed.  Constitutional:      Appearance: Normal appearance.  HENT:     Head: Normocephalic and atraumatic.     Mouth/Throat:     Mouth: Mucous membranes are moist.     Pharynx: Oropharynx is clear. No oropharyngeal exudate or posterior oropharyngeal erythema.  Pulmonary:     Effort: Pulmonary effort is normal.  Chest:     Breasts:        Right: Normal. No swelling, bleeding, inverted nipple, mass, nipple discharge, skin change or tenderness.        Left: Normal. No swelling, bleeding, inverted nipple, mass, nipple discharge, skin change or tenderness.  Abdominal:     General: Abdomen is flat.     Palpations: There is no mass.     Tenderness: There is no abdominal tenderness. There is no rebound.  Genitourinary:    General: Normal vulva.     Exam position: Lithotomy position.     Pubic Area: No rash or pubic lice.      Labia:        Right: No rash or lesion.  Left: No rash or lesion.      Vagina: Normal. No vaginal discharge (white, moderate amount, ph<4.5), erythema, bleeding or lesions.     Cervix: No cervical motion tenderness, discharge, friability, lesion or erythema.     Uterus: Normal.      Adnexa: Right adnexa normal and left adnexa normal.     Rectum: Normal.  Lymphadenopathy:     Head:     Right side of head: No preauricular or posterior auricular adenopathy.     Left side of head: No preauricular or posterior auricular adenopathy.     Cervical: No cervical adenopathy.     Upper Body:     Right upper body: No supraclavicular or axillary adenopathy.     Left upper  body: No supraclavicular or axillary adenopathy.     Lower Body: No right inguinal adenopathy. No left inguinal adenopathy.  Skin:    General: Skin is warm and dry.     Findings: No rash.  Neurological:     Mental Status: She is alert and oriented to person, place, and time.        Assessment and Plan:  Peggy Landry is a 34 y.o. female presenting to the [facility] Department for a well woman exam/family planning visit  Contraception counseling: Reviewed all forms of birth control options in the tiered based approach including abstinence; over the counter/barrier methods; hormonal contraceptive medication including pill, patch, ring, injection, contraceptive implant; hormonal and nonhormonal IUDs; permanent sterilization options including vasectomy and the various tubal sterilization modalities. Risks, benefits, how to discontinue and typical effectiveness rates were reviewed.  Questions were answered.  Written information was also given to the patient to review.  Patient desires Depo, this was prescribed for patient. She will follow up in  3 months for surveillance.  She was told to call with any further questions, or with any concerns about this method of contraception.  Emphasized use of condoms 100% of the time for STI prevention.  Emergency Contraception: n/a   1. Family planning services -Rx depo x1 yr. Counseling as above, including possible risks of Depo use >2 yrs to BMD. Alternative BCM discussed, pt would like to continue Depo. Suggested calcium (1200 mg daily) and vitamin D (800 IU daily) supplements, weight bearing exercise and avoiding cigarette smoking and excessive alcohol consumption to promote bone health.  -No pregnancy test needed d/t no sex since LMP - medroxyPROGESTERone (DEPO-PROVERA) injection 150 mg  2. Screening examination for venereal disease -Pt with symptoms. Screenings today as below. Treat wet prep per standing order. -Patient does meet  criteria for HepB, HepC Screening. Declines this as well as HIV and syphilis screenings. -Counseled on warning s/sx and when to seek care. Recommended condom use with all sex and discussed importance of condom use for STI prevention. - WET PREP FOR [place], YEAST, [place] [facility]     Return in about 3 months (around 02/06/2020) for Depo.  No future appointments.  Kandee Keen, PA-C

## 2019-11-07 NOTE — Progress Notes (Signed)
In desiring Depo; ROI signed for [facility] paps (unsure when had last pap); desires HIV/RPR today ; c/o last menses had abnormal brownish "tissue"; Sharlette Dense, RN

## 2019-11-12 ENCOUNTER — Encounter: Payer: Self-pay | Admitting: Physician Assistant

## 2019-11-12 LAB — HM PAP SMEAR

## 2020-01-31 ENCOUNTER — Ambulatory Visit (LOCAL_COMMUNITY_HEALTH_CENTER): Payer: Medicaid Other

## 2020-01-31 ENCOUNTER — Other Ambulatory Visit: Payer: Self-pay

## 2020-01-31 VITALS — BP 110/71 | HR 83 | Ht 66.0 in | Wt 145.5 lb

## 2020-01-31 DIAGNOSIS — Z30013 Encounter for initial prescription of injectable contraceptive: Secondary | ICD-10-CM

## 2020-01-31 DIAGNOSIS — Z3009 Encounter for other general counseling and advice on contraception: Secondary | ICD-10-CM | POA: Diagnosis not present

## 2020-01-31 NOTE — Progress Notes (Signed)
Depo given in L gluteal muscle; pt tolerated well. Satisfied with current method. Return card given for 04/17/2020.  Vernelle Emerald, RN

## 2020-01-31 NOTE — Progress Notes (Signed)
Addendum to verify Level of Service was documented. Jossie Ng, RN

## 2020-03-14 ENCOUNTER — Encounter: Payer: Self-pay | Admitting: Family Medicine

## 2020-03-14 ENCOUNTER — Ambulatory Visit: Payer: Medicaid Other | Admitting: Family Medicine

## 2020-03-14 ENCOUNTER — Other Ambulatory Visit: Payer: Self-pay

## 2020-03-14 DIAGNOSIS — Z113 Encounter for screening for infections with a predominantly sexual mode of transmission: Secondary | ICD-10-CM | POA: Diagnosis not present

## 2020-03-14 LAB — WET PREP FOR TRICH, YEAST, CLUE
Trichomonas Exam: NEGATIVE
Yeast Exam: NEGATIVE

## 2020-03-14 NOTE — Progress Notes (Signed)
  [facility] Department STI clinic/screening visit  Subjective:  TAWANDA SCHALL is a 34 y.o. female being seen today for an STI screening visit. The patient reports they do have symptoms.  Patient reports that they do not desire a pregnancy in the next year.   They reported they are not interested in discussing contraception today.  No LMP recorded. Patient has had an injection.   Patient has the following medical conditions:   Patient Active Problem List   Diagnosis Date Noted  . Asthma 11/07/2019  . Depression 11/07/2019  . 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 SEXUALLY TRANSMITTED DISEASE    screening     HPI  Patient reports having vaginal odor x 1 weeks, Patient states "I think I have BV again".    Last HIV test per patient/review of record was 2020 Patient reports last pap was 2019  See flowsheet for further details and programmatic requirements.    The following portions of the patient's history were reviewed and updated as appropriate: allergies, current medications, past medical history, past social history, past surgical history and problem list.  Objective:  There were no vitals filed for this visit.  Physical Exam Constitutional:      Appearance: Normal appearance.  HENT:     Head: Normocephalic.     Mouth/Throat:     Mouth: Mucous membranes are moist.     Pharynx: Oropharynx is clear. No oropharyngeal exudate or posterior oropharyngeal erythema.  Abdominal:     Palpations: Abdomen is soft. There is no mass.     Tenderness: There is no abdominal tenderness. There is no guarding.  Genitourinary:    Comments: External genitalia without, lice, nits, erythema, edema , lesions or inguinal adenopathy. Vagina with normal mucosa and discharge and pH equals 4.  Cervix without visual lesions, uterus firm, mobile, non-tender, no masses, CMT adnexal fullness or tenderness.   Musculoskeletal:     Cervical back: Normal range  of motion.  Lymphadenopathy:     Cervical: No cervical adenopathy.  Skin:    General: Skin is warm and dry.     Findings: No bruising, erythema, lesion or rash.  Neurological:     Mental Status: She is alert.  Psychiatric:        Mood and Affect: Mood normal.        Behavior: Behavior normal.      Assessment and Plan:  DAYONNA SELBE is a 34 y.o. female presenting to the [facility] Department for STI screening  1. Screening examination for venereal disease - HIV/HCV [place] [facility] - HBV Antigen/Antibody [facility] - Syphilis Serology, [place] [facility] - WET PREP FOR TRICH, YEAST, CLUE - Chlamydia/Gonorrhea [place] [facility]  Patient accepted all screenings including ,vaginal  CT/GC and bloodwork for HIV/RPR, HCV HBV.   Patient meets criteria for HepB screening? Yes. Ordered? Yes Patient meets criteria for HepC screening? Yes. Ordered? Yes  Per wet prep no treatment needed.   Discussed time line for [facility] results and that patient will be called with positive results and encouraged patient to call if she had not heard in 2 weeks.  Counseled to return or seek care for continued or worsening symptoms Recommended condom use with all sex  Patient is currently using depo to prevent pregnancy.     No follow-ups on file.  No future appointments.  Wendi Snipes, RN

## 2020-03-20 ENCOUNTER — Encounter: Payer: Self-pay | Admitting: Family Medicine

## 2020-03-20 LAB — HM HEPATITIS C SCREENING LAB: HM Hepatitis Screen: NEGATIVE

## 2020-03-20 LAB — HM HIV SCREENING LAB: HM HIV Screening: NEGATIVE

## 2020-03-22 NOTE — Progress Notes (Signed)
Attestation of Attending Supervision of Enhanced Role Nurse:  At public health departments, Enhanced Role Nurses work under standing orders and procedures to provide STI related screening services.. I agree with the care provided to this patient and was available for any consultation as needed.  I have reviewed the RN's note and chart.  I was not consulted by the RN for the patient. If consulted documentation of consult is reflected in the RN's documentation.  ° °Tamberly Niles Theoren Palka, MD, MPH, ABFM °Medical Director  °[facility].  ° °

## 2020-03-27 ENCOUNTER — Encounter: Payer: Self-pay | Admitting: Family Medicine

## 2020-03-27 LAB — HEPATITIS B SURFACE ANTIGEN

## 2020-04-24 ENCOUNTER — Other Ambulatory Visit: Payer: Self-pay

## 2020-04-24 ENCOUNTER — Ambulatory Visit (INDEPENDENT_AMBULATORY_CARE_PROVIDER_SITE_OTHER): Payer: Medicaid Other | Admitting: Dermatology

## 2020-04-24 DIAGNOSIS — R61 Generalized hyperhidrosis: Secondary | ICD-10-CM | POA: Diagnosis not present

## 2020-04-24 MED ORDER — GLYCOPYRROLATE 1 MG PO TABS: ORAL_TABLET | ORAL | 0 refills | Status: DC

## 2020-04-24 NOTE — Progress Notes (Signed)
   New Patient Visit  Subjective  Peggy Landry is a 34 y.o. female who presents for the following: New Patient (Initial Visit).  Patient presents today as a new patient to establish care. Patient is experiencing excessive sweating for most of life and seems to be getting worse the older she becomes. Patient was wondering if there is any medication that she can use or take that can help. Patient states that she has no skin cancer history  The following portions of the chart were reviewed this encounter and updated as appropriate:  Tobacco  Allergies  Meds  Problems  Med Hx  Surg Hx  Fam Hx      Review of Systems:  No other skin or systemic complaints except as noted in HPI or Assessment and Plan.  Objective  Well appearing patient in no apparent distress; mood and affect are within normal limits.  A focused examination was performed including face, neck, axillae, arms, hands, fingernails. Relevant physical exam findings are noted in the Assessment and Plan.  Objective  Left Axilla: Glistening    Assessment & Plan  Hyperhidrosis Left Axilla  Chronic, flared  Of hands, axillae, buttocks  Start glycopyrrolate 1 mg tablets  Glycopyrrolate can significantly increase the risk of heat stroke so you should avoid using it in the heat, particularly while active. It can also cause dry mouth, blurred vision, difficulty with urination, headache, constipation, and racing heart. Take it only as directed. Never take more than 8 tablets total per day.  Start Glycopyrrolate 1 mg take 1 po daily for 3 days. If no side effects and still with excess sweating, increase to twice a day. If after 3 days still with excess sweating and no side effects, increase to one tablet three times per day. Make sure not to take this medication when going outside in heat or doing strenuous activities  Recommend using Carpe lotion for hands as needed  Discussed other treatments such as Delanna Notice can be used  only for certain areas  glycopyrrolate (ROBINUL) 1 MG tablet - Left Axilla  Return in about 1 month (around 05/25/2020) for Hyperhidrosis.  ILeward Quan, CMA, am acting as scribe for Darden Dates, MD .  Documentation: I have reviewed the above documentation for accuracy and completeness, and I agree with the above.  Darden Dates, MD

## 2020-04-24 NOTE — Patient Instructions (Addendum)
Recommend daily broad spectrum sunscreen SPF 30+ to sun-exposed areas, reapply every 2 hours as needed. Call for new or changing lesions.   Discussed cant take when outside in sun because you can over heat. Can give dry mouth, little dry eye, make it hard to urinate, headache, racing heart beat.  Avoid Sage vit and elderberry supplement Start Robinul 1 po for 3 day with no problems and still sweating go to BID for 3 days if no problems and still sweating return to clinic.. make sure not to take this medication when going out side in heat or doing strenuous activities  Recommend using Carpe lotion for hands, do not but in buttock crease.

## 2020-04-25 ENCOUNTER — Ambulatory Visit (LOCAL_COMMUNITY_HEALTH_CENTER): Payer: Medicaid Other

## 2020-04-25 VITALS — BP 110/75 | HR 95 | Ht 66.0 in | Wt 146.0 lb

## 2020-04-25 DIAGNOSIS — Z30013 Encounter for initial prescription of injectable contraceptive: Secondary | ICD-10-CM | POA: Diagnosis not present

## 2020-04-25 DIAGNOSIS — Z3009 Encounter for other general counseling and advice on contraception: Secondary | ICD-10-CM

## 2020-04-25 NOTE — Progress Notes (Signed)
Depo administered without difficulty per 11/07/2019 written order of Samara Snide PA-C and client tolerated with minimal c/o. Administered in left gluteus. Jossie Ng, RN

## 2020-05-12 ENCOUNTER — Encounter: Payer: Self-pay | Admitting: Dermatology

## 2020-05-16 ENCOUNTER — Other Ambulatory Visit: Payer: Self-pay | Admitting: Dermatology

## 2020-05-16 DIAGNOSIS — R61 Generalized hyperhidrosis: Secondary | ICD-10-CM

## 2020-05-28 ENCOUNTER — Ambulatory Visit: Payer: Medicaid Other | Admitting: Dermatology

## 2020-05-28 ENCOUNTER — Other Ambulatory Visit: Payer: Self-pay

## 2020-05-28 DIAGNOSIS — L74519 Primary focal hyperhidrosis, unspecified: Secondary | ICD-10-CM

## 2020-05-28 DIAGNOSIS — R61 Generalized hyperhidrosis: Secondary | ICD-10-CM

## 2020-05-28 MED ORDER — GLYCOPYRROLATE 1 MG PO TABS: ORAL_TABLET | ORAL | 0 refills | Status: DC

## 2020-05-28 NOTE — Patient Instructions (Addendum)
Glycopyrrolate can significantly increase the risk of heat stroke so you should avoid using it in the heat, particularly while active. It can also cause dry mouth, blurred vision, difficulty with urination, headache, constipation, and racing heart. Take it only as directed. Never take more than 8 tablets total per day.  Recommend Carpe lotion or she can use Dove clinical to underarms and tops of legs, buttocks.   Restart glycopyrrolate 1mg , taking 3 a day for 2 days then can increase to 4 a day for 4 days, then 5 a day for 4 days, then 6 a day IF you are not having trouble with side effects and you continue having too much sweating after each increase. Patient to add 1 at night and if doing ok can add 1 to the morning, then 1 at noontime if still doing ok.

## 2020-05-28 NOTE — Progress Notes (Signed)
   Follow-Up Visit   Subjective  Peggy Landry is a 34 y.o. female who presents for the following: Excessive Sweating (Patient here today for 1 month follow up. She started robinul and was taking 3mg  daily but still had sweating, mostly in groin area. Axillae improved while on robinul. ).  Patient advises she did have dry mouth while taking robinul, manageable.  Patient using Secret Sport deodorant and has used in the past. Patient could not tolerate Drysol due to irritation.   The following portions of the chart were reviewed this encounter and updated as appropriate:  Tobacco  Allergies  Meds  Problems  Med Hx  Surg Hx  Fam Hx      Review of Systems:  No other skin or systemic complaints except as noted in HPI or Assessment and Plan.  Objective  Well appearing patient in no apparent distress; mood and affect are within normal limits.  A focused examination was performed including face, axillae. Relevant physical exam findings are noted in the Assessment and Plan.  Objective  bilateral axillae: Excessive sweating   Assessment & Plan  Primary focal hyperhidrosis bilateral axillae  Chronic condition, no cure, only control. Not currently at goal.   Affecting axillae, inguinal folds, buttocks  Restart glycopyrrolate 1mg , taking one 3 times per day for 2 days then can increase to 4 tablets a day for 4 days (1 in the am, 1 at noon, 2 at night), then 5 tablets a day for 4 days (2 in am, 1 at noon, 2 at night), then 6 a day (2 in am, 2 at noon, 2 at night) IF she is not having trouble with side effects and she continues having too much sweating after each increase.    Glycopyrrolate can significantly increase the risk of heat stroke so you should avoid using it in the heat, particularly while active. It can also cause dry mouth, blurred vision, difficulty with urination, headache, constipation, and racing heart. Take it only as directed. Never take more than 8  tablets total per day.  Recommend Carpe lotion or she can use Dove clinical to underarms and tops of legs, buttocks.   Consider Botox injections in future if incomplete response to glycopyrrolate  Hyperhidrosis  Reordered Medications glycopyrrolate (ROBINUL) 1 MG tablet  Return in about 4 weeks (around 06/25/2020).  , RMA, am acting as scribe for 14/01/2020, MD .  Documentation: I have reviewed the above documentation for accuracy and completeness, and I agree with the above.  Anise Salvo, MD

## 2020-06-10 ENCOUNTER — Encounter: Payer: Self-pay | Admitting: Dermatology

## 2020-06-12 ENCOUNTER — Ambulatory Visit: Payer: Medicaid Other | Admitting: Dermatology

## 2020-06-19 ENCOUNTER — Ambulatory Visit: Payer: Medicaid Other

## 2020-06-19 ENCOUNTER — Other Ambulatory Visit: Payer: Self-pay | Admitting: Dermatology

## 2020-06-19 DIAGNOSIS — R61 Generalized hyperhidrosis: Secondary | ICD-10-CM

## 2020-06-20 ENCOUNTER — Ambulatory Visit: Payer: Medicaid Other

## 2020-06-24 ENCOUNTER — Ambulatory Visit: Payer: Medicaid Other

## 2020-06-27 ENCOUNTER — Ambulatory Visit: Payer: Medicaid Other | Admitting: Dermatology

## 2020-07-04 ENCOUNTER — Encounter: Payer: Self-pay | Admitting: Intensive Care

## 2020-07-04 ENCOUNTER — Other Ambulatory Visit: Payer: Self-pay

## 2020-07-04 ENCOUNTER — Emergency Department
Admission: EM | Admit: 2020-07-04 | Discharge: 2020-07-04 | Disposition: A | Discharge status: Home / Self Care | Payer: Medicaid Other | Attending: Emergency Medicine | Admitting: Emergency Medicine

## 2020-07-04 DIAGNOSIS — N898 Other specified noninflammatory disorders of vagina: Secondary | ICD-10-CM | POA: Diagnosis not present

## 2020-07-04 DIAGNOSIS — F17210 Nicotine dependence, cigarettes, uncomplicated: Secondary | ICD-10-CM | POA: Insufficient documentation

## 2020-07-04 DIAGNOSIS — J0110 Acute frontal sinusitis, unspecified: Secondary | ICD-10-CM

## 2020-07-04 DIAGNOSIS — M545 Low back pain, unspecified: Secondary | ICD-10-CM | POA: Insufficient documentation

## 2020-07-04 DIAGNOSIS — B9689 Other specified bacterial agents as the cause of diseases classified elsewhere: Secondary | ICD-10-CM

## 2020-07-04 DIAGNOSIS — Z202 Contact with and (suspected) exposure to infections with a predominantly sexual mode of transmission: Secondary | ICD-10-CM | POA: Diagnosis not present

## 2020-07-04 DIAGNOSIS — J45909 Unspecified asthma, uncomplicated: Secondary | ICD-10-CM | POA: Insufficient documentation

## 2020-07-04 LAB — URINALYSIS, COMPLETE (UACMP) WITH MICROSCOPIC
Bilirubin Urine: NEGATIVE
Glucose, UA: NEGATIVE mg/dL
Hgb urine dipstick: NEGATIVE
Ketones, ur: NEGATIVE mg/dL
Leukocytes,Ua: NEGATIVE
Nitrite: POSITIVE — AB
Protein, ur: NEGATIVE mg/dL
Specific Gravity, Urine: 1.025 (ref 1.005–1.030)
pH: 5 (ref 5.0–8.0)

## 2020-07-04 LAB — BASIC METABOLIC PANEL
Anion gap: 9 (ref 5–15)
BUN: 15 mg/dL (ref 6–20)
CO2: 27 mmol/L (ref 22–32)
Calcium: 9.5 mg/dL (ref 8.9–10.3)
Chloride: 102 mmol/L (ref 98–111)
Creatinine, Ser: 0.68 mg/dL (ref 0.44–1.00)
GFR, Estimated: >60 mL/min (ref >60)
Glucose, Bld: 92 mg/dL (ref 70–99)
Potassium: 3.8 mmol/L (ref 3.5–5.1)
Sodium: 138 mmol/L (ref 135–145)

## 2020-07-04 LAB — CBC
HCT: 41.9 % (ref 36.0–46.0)
Hemoglobin: 14.1 g/dL (ref 12.0–15.0)
MCH: 32 pg (ref 26.0–34.0)
MCHC: 33.7 g/dL (ref 30.0–36.0)
MCV: 95 fL (ref 80.0–100.0)
Platelets: 183 10*3/uL (ref 150–400)
RBC: 4.41 MIL/uL (ref 3.87–5.11)
RDW: 13.4 % (ref 11.5–15.5)
WBC: 7.7 10*3/uL (ref 4.0–10.5)
nRBC: 0 % (ref 0.0–0.2)

## 2020-07-04 LAB — CHLAMYDIA/NGC RT PCR (ARMC ONLY)
Chlamydia Tr: NOT DETECTED
N gonorrhoeae: NOT DETECTED

## 2020-07-04 LAB — WET PREP, GENITAL
Sperm: NONE SEEN
Trich, Wet Prep: NONE SEEN
WBC, Wet Prep HPF POC: NONE SEEN
Yeast Wet Prep HPF POC: NONE SEEN

## 2020-07-04 LAB — POC URINE PREG, ED: Preg Test, Ur: NEGATIVE

## 2020-07-04 LAB — CBG MONITORING, ED: Glucose-Capillary: 104 mg/dL — ABNORMAL HIGH (ref 70–99)

## 2020-07-04 MED ORDER — FLUCONAZOLE 200 MG PO TABS: 200.0000 mg | ORAL_TABLET | Freq: Once | ORAL | 0 refills | Status: AC

## 2020-07-04 MED ORDER — DOXYCYCLINE HYCLATE 100 MG PO TABS
100.0000 mg | ORAL_TABLET | Freq: Once | ORAL | Status: AC
  Administered 2020-07-04: 23:00:00 100 mg via ORAL
  Filled 2020-07-04: qty 1

## 2020-07-04 MED ORDER — DOXYCYCLINE MONOHYDRATE 100 MG PO TABS: 100.0000 mg | ORAL_TABLET | Freq: Two times a day (BID) | ORAL | 0 refills | Status: AC

## 2020-07-04 MED ORDER — METRONIDAZOLE 500 MG PO TABS
500.0000 mg | ORAL_TABLET | Freq: Once | ORAL | Status: AC
  Administered 2020-07-04: 23:00:00 500 mg via ORAL
  Filled 2020-07-04: qty 1

## 2020-07-04 MED ORDER — METRONIDAZOLE 500 MG PO TABS: 500.0000 mg | ORAL_TABLET | Freq: Two times a day (BID) | ORAL | 0 refills | Status: AC

## 2020-07-04 NOTE — ED Triage Notes (Signed)
Patient reports lower back pain, dark colored urine, strong smelling urine for 2 months. She believes a medication she was taking for sweat glands caused problems. C/o sold symptoms for over a week. Reports dry mouth.

## 2020-07-04 NOTE — Discharge Instructions (Addendum)
Please take antibiotics as prescribed.  Avoid sexual activity until symptoms resolve.  Return to the ER for any fevers, abdominal pain, worsening symptoms or changes in your health.

## 2020-07-04 NOTE — ED Provider Notes (Signed)
[facility] EMERGENCY DEPARTMENT Provider Note   CSN: [id number] Arrival date & time: 07/04/20  1655     History Chief Complaint  Patient presents with  . Back Pain  . Allergic Reaction    Peggy Landry is a 34 y.o. female presents to the emergency department for evaluation of multiple medical complaints consisting of low back pain, dark foul-smelling urine x2 months.  She is also complaining of vaginal discharge and sinus congestion, pressure x2 weeks that has increased over the last few days.  She denies any fevers, nausea, vomiting or belly pain.  No painful urination or hematuria.  She also mentioned she like to be checked for STD.  She denies any vaginal pain, itching.  No excessive amount of discharge in the vaginal area.  HPI     History reviewed. No pertinent past medical history.  Patient Active Problem List   Diagnosis Date Noted  . Asthma 11/07/2019  . Depression 11/07/2019  . 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 No pertinent surgical history.   OB History   No obstetric history on file.     Family History  Problem Relation Age of Onset  . Hypertension Mother   . Dementia Father   . COPD Father   . Breast cancer Neg Hx     Social History   Tobacco Use  . Smoking status: Current Every Day Smoker    Packs/day: 1.00    Types: Cigarettes  . Smokeless tobacco: Never Used  . Tobacco comment: Quitline info given and refer to PCP for meds.  Vaping Use  . Vaping Use: Never used  Substance Use Topics  . Alcohol use: Yes    Alcohol/week: 7.0 standard drinks    Types: 4 Glasses of wine, 3 Cans of beer per week  . Drug use: Not Currently    Types: Marijuana    Comment: seldomly    Home Medications Prior to Admission medications   Medication Sig Start Date End Date Taking? Authorizing Provider  albuterol (VENTOLIN HFA) 108 (90 Base) MCG/ACT inhaler Inhale 2 puffs into the lungs every 6 (six) hours as needed for wheezing  or shortness of breath. Patient not taking: Reported on 04/25/2020 08/24/19   Faythe Ghee, PA-C  glycopyrrolate (ROBINUL) 1 MG tablet TAKING 3 TAB A DAY FOR 2 DAYS THEN CAN INCREASE TO 4 TABS A DAY FOR 4 DAYS, THEN 5 TABS A DAY FOR 4 DAYS, THEN 6 TABS A DAY AS DIRECTED 06/19/20   Moye, [place], MD  glycopyrrolate (ROBINUL) 1 MG tablet TAKE 1 TAB DAILY X3 DAYS, IF NO PROBLEM AND STILL SWEATING GO TO TWICE DAILY, IF NO PROBLEMS AND STILL SWEATING GO TO 3X DAILY 06/19/20   Moye, [place], MD  mirtazapine (REMERON) 15 MG tablet Take 15 mg by mouth at bedtime. For depression  Patient not taking: Reported on 04/25/2020    [provider]    Allergies    Patient has no known allergies.  Review of Systems   Review of Systems  Constitutional: Negative for fever.  HENT: Positive for sinus pressure and sinus pain. Negative for sore throat, tinnitus and trouble swallowing.   Respiratory: Negative for cough and shortness of breath.   Cardiovascular: Negative for chest pain.  Gastrointestinal: Negative for abdominal pain, nausea and vomiting.  Genitourinary: Positive for vaginal discharge. Negative for difficulty urinating, dysuria, hematuria, pelvic pain and vaginal pain.  Skin: Negative for rash and wound.  Neurological: Negative for headaches.  Physical Exam Updated Vital Signs BP 121/82 (BP Location: Left Arm)   Pulse (!) 105   Temp 98.9 F (37.2 C) (Oral)   Resp 16   Ht 5\' 6"  (1.676 m)   Wt 64 kg   SpO2 100%   BMI 22.76 kg/m   Physical Exam  ED Results / Procedures / Treatments   Labs (all labs ordered are listed, but only abnormal results are displayed) Labs Reviewed  URINALYSIS, COMPLETE (UACMP) WITH MICROSCOPIC - Abnormal; Notable for the following components:      Result Value   Color, Urine YELLOW (*)    APPearance HAZY (*)    Nitrite POSITIVE (*)    Bacteria, UA MANY (*)    All other components within normal limits  CBG MONITORING, ED - Abnormal; Notable for  the following components:   Glucose-Capillary 104 (*)    All other components within normal limits  WET PREP, GENITAL  CHLAMYDIA/NGC RT PCR ([facility] ONLY)  CBC  BASIC METABOLIC PANEL  POC URINE PREG, ED    EKG None  Radiology No results found.  Procedures Procedures (including critical care time)  Medications Ordered in ED Medications - No data to display  ED Course  I have reviewed the triage vital signs and the nursing notes.  Pertinent labs & imaging results that were available during my care of the patient were reviewed by me and considered in my medical decision making (see chart for details).    MDM Rules/Calculators/A&P                          35 year old female with concern for strong smelling urine with dark color.  CBC and BMP normal.  Wet prep positive for clue cells.  Pregnancy test negative.  Urinalysis negative for infection.  Gonorrhea chlamydia test pending.  History exam concerning for sinusitis.  She started on antibiotics for sinus infection.  Also given prescription for bacterial vaginitis.  She understands signs symptoms return to the ER for. Final Clinical Impression(s) / ED Diagnoses Final diagnoses:  Vaginal discharge  Acute non-recurrent frontal sinusitis  Potential exposure to STD  Acute midline low back pain without sciatica    Rx / DC Orders ED Discharge Orders    None       31 07/04/20 2239    2240, MD 07/04/20 2315

## 2020-07-18 ENCOUNTER — Other Ambulatory Visit: Payer: Self-pay

## 2020-07-18 ENCOUNTER — Ambulatory Visit (LOCAL_COMMUNITY_HEALTH_CENTER): Payer: Medicaid Other

## 2020-07-18 VITALS — BP 113/72 | Ht 66.0 in | Wt 151.5 lb

## 2020-07-18 DIAGNOSIS — Z30013 Encounter for initial prescription of injectable contraceptive: Secondary | ICD-10-CM

## 2020-07-18 DIAGNOSIS — Z3042 Encounter for surveillance of injectable contraceptive: Secondary | ICD-10-CM

## 2020-07-18 DIAGNOSIS — Z3009 Encounter for other general counseling and advice on contraception: Secondary | ICD-10-CM | POA: Diagnosis not present

## 2020-07-18 NOTE — Progress Notes (Signed)
12 weeks post depo. Voices no problems. Depo 150mg  IM given R UOQ per order by , PA-C dated 11/07/2019. Tolerated well. Next depo due 10/03/2020, pt aware. 10/05/2020, RN

## 2020-07-22 ENCOUNTER — Ambulatory Visit: Payer: Medicaid Other

## 2020-07-31 ENCOUNTER — Ambulatory Visit (INDEPENDENT_AMBULATORY_CARE_PROVIDER_SITE_OTHER): Payer: Medicaid Other | Admitting: Dermatology

## 2020-07-31 ENCOUNTER — Other Ambulatory Visit: Payer: Self-pay

## 2020-07-31 DIAGNOSIS — L74510 Primary focal hyperhidrosis, axilla: Secondary | ICD-10-CM | POA: Diagnosis not present

## 2020-07-31 DIAGNOSIS — L74519 Primary focal hyperhidrosis, unspecified: Secondary | ICD-10-CM | POA: Diagnosis not present

## 2020-07-31 MED ORDER — GLYCOPYRROLATE 1 MG PO TABS: ORAL_TABLET | ORAL | 0 refills | Status: DC

## 2020-07-31 NOTE — Patient Instructions (Addendum)
For Hyperhidrosis (Excess Sweating) - Recommend trial of Odaban spray or SweatBlock wipes start twice per week. Use more often if needed to stop sweating. Use less often (once per week) if causing irritation.   Recommend also starting Ban antiperspirant or Carpe lotion daily to affected areas.   Glycopyrrolate can significantly increase the risk of heat stroke so you should avoid using it in the heat, particularly while active. It can also cause dry mouth, blurred vision, difficulty with urination, headache, constipation, and racing heart. Take it only as directed. Never take more than 8 tablets total per day.

## 2020-07-31 NOTE — Progress Notes (Unsigned)
   Follow-Up Visit   Subjective  Peggy Landry is a 35 y.o. female who presents for the following: Excessive Sweating (Patient currently using Glycopyrrolate 1mg  - up to 6 tabs per day which she uses about 4 times per  week. She does have the side effect of dry mouth, and still has break through sweating in the groin. ).    The following portions of the chart were reviewed this encounter and updated as appropriate:   Tobacco  Allergies  Meds  Problems  Med Hx  Surg Hx  Fam Hx     Review of Systems:  No other skin or systemic complaints except as noted in HPI or Assessment and Plan.  Objective  Well appearing patient in no apparent distress; mood and affect are within normal limits.  A focused examination was performed including face, neck, hands, arms. Relevant physical exam findings are noted in the Assessment and Plan.  Objective  axilla, inguinal creases, buttocks crease: Excessive sweating   Assessment & Plan  Primary focal hyperhidrosis axilla, inguinal creases, buttocks crease  Chronic condition with duration over one year. Condition is bothersome to patient. Not currently at goal.  For Hyperhidrosis (Excess Sweating) - Recommend trial of Odaban spray or SweatBlock wipes start twice per week. Use more often if needed to stop sweating. Use less often (once per week) if causing irritation. Recommend adding antiperspirant like Ban (green, unscented, roll-on) to aa's daily.   Continue Glycopyrrolate 1mg  - may increase to 3 tabs po QAM, 2 tabs po mid-day, and 3 tabs po QHS (#8) if still not controlled with OTC topical treatment. Glycopyrrolate can significantly increase the risk of heat stroke so you should avoid using it in the heat, particularly while active. It can also cause dry mouth, blurred vision, difficulty with urination, headache, constipation, and racing heart. Take it only as directed. Never take more than 8 tablets total per day.  Recommend Biotene spray for  dry mouth and/or a sugar free lemon drop.    glycopyrrolate (ROBINUL) 1 MG tablet - axilla, inguinal creases, buttocks crease  Return in about 6 weeks (around 09/11/2020) for hyperhidrosis follow up .  , CMA, am acting as scribe for 09/13/2020, MD .  Documentation: I have reviewed the above documentation for accuracy and completeness, and I agree with the above.  Maylene Roes, MD

## 2020-08-01 ENCOUNTER — Encounter: Payer: Self-pay | Admitting: Dermatology

## 2020-09-26 ENCOUNTER — Ambulatory Visit: Payer: Medicaid Other | Admitting: Dermatology

## 2020-10-16 ENCOUNTER — Other Ambulatory Visit: Payer: Self-pay

## 2020-10-16 ENCOUNTER — Ambulatory Visit (LOCAL_COMMUNITY_HEALTH_CENTER): Payer: Medicaid Other

## 2020-10-16 VITALS — BP 117/81 | Wt 150.0 lb

## 2020-10-16 DIAGNOSIS — Z30013 Encounter for initial prescription of injectable contraceptive: Secondary | ICD-10-CM

## 2020-10-16 DIAGNOSIS — Z3009 Encounter for other general counseling and advice on contraception: Secondary | ICD-10-CM

## 2020-10-16 MED ORDER — MEDROXYPROGESTERONE ACETATE 150 MG/ML IM SUSP
150.0000 mg | Freq: Once | INTRAMUSCULAR | Status: AC
Start: 2020-10-16 — End: 2020-10-16
  Administered 2020-10-16: 150 mg via INTRAMUSCULAR

## 2020-10-16 NOTE — Progress Notes (Signed)
Depo given per Maximiano Coss PA order on 10/2019; tolerated well. Co patient that she will need a PE before next Depo.  Plans to schedule PE/Depo appt in June 2022.  Patient also states has a PE coming up with Phineas Real; instructed her to bring copy of visit so that our provider could right Depo order Richmond Campbell, RN

## 2021-03-06 ENCOUNTER — Telehealth: Payer: Self-pay | Admitting: Family Medicine

## 2021-03-06 NOTE — Telephone Encounter (Signed)
Discussed pt phone call with Sadie Haber, PA.  Phone call to pt. Provider can discuss strategies to try to prevent BV, but there is not anything we can prescribe that will definitely permanently get rid of it. Pt counseled that she would be referred to another provider for long term/chronic issues for BV if she is interested in that; we would not be able to manage chronic issue. Pt is still interested in coming to [facility] for evaluation and plans to keep 03/11/21 appt.  Pt counseled not to place anything like vaginal creams, boric acid, refresh, douche, etc in vagina at least 3 days prior to appt (pt states she uses boric acid suppositories). Also, answered questions about some depo side effects. Pt to discuss depo and effects on period further with provider at visit.

## 2021-03-06 NOTE — Telephone Encounter (Signed)
Patient states she has had continuous problems with BV and would like to get treatment to eradicate it for good as previous ones have failed to do so. Patient as appointment for 03/11/21.

## 2021-03-11 ENCOUNTER — Other Ambulatory Visit: Payer: Self-pay

## 2021-03-11 ENCOUNTER — Ambulatory Visit (LOCAL_COMMUNITY_HEALTH_CENTER): Payer: Medicaid Other | Admitting: Advanced Practice Midwife

## 2021-03-11 ENCOUNTER — Encounter: Payer: Self-pay | Admitting: Advanced Practice Midwife

## 2021-03-11 VITALS — BP 113/68 | Ht 67.0 in | Wt 150.8 lb

## 2021-03-11 DIAGNOSIS — Z3202 Encounter for pregnancy test, result negative: Secondary | ICD-10-CM

## 2021-03-11 DIAGNOSIS — Z3009 Encounter for other general counseling and advice on contraception: Secondary | ICD-10-CM | POA: Diagnosis not present

## 2021-03-11 DIAGNOSIS — B379 Candidiasis, unspecified: Secondary | ICD-10-CM

## 2021-03-11 DIAGNOSIS — F4310 Post-traumatic stress disorder, unspecified: Secondary | ICD-10-CM | POA: Insufficient documentation

## 2021-03-11 DIAGNOSIS — F17200 Nicotine dependence, unspecified, uncomplicated: Secondary | ICD-10-CM | POA: Diagnosis not present

## 2021-03-11 DIAGNOSIS — F319 Bipolar disorder, unspecified: Secondary | ICD-10-CM

## 2021-03-11 DIAGNOSIS — F419 Anxiety disorder, unspecified: Secondary | ICD-10-CM | POA: Insufficient documentation

## 2021-03-11 LAB — WET PREP FOR TRICH, YEAST, CLUE
Clue Cell Exam: POSITIVE — AB
Trichomonas Exam: NEGATIVE

## 2021-03-11 LAB — PREGNANCY, URINE: Preg Test, Ur: NEGATIVE

## 2021-03-11 MED ORDER — METRONIDAZOLE 500 MG PO TABS: 500.0000 mg | ORAL_TABLET | Freq: Two times a day (BID) | ORAL | 0 refills | Status: AC

## 2021-03-11 MED ORDER — CLOTRIMAZOLE 1 % VA CREA: 1.0000 | TOPICAL_CREAM | Freq: Every day | VAGINAL | 0 refills | Status: AC

## 2021-03-11 NOTE — Progress Notes (Signed)
[facility] DEPARTMENT [facility] [address] Main Number: [phone]    Family Planning Visit- Initial Visit  Subjective:  Peggy Landry is a 35 y.o.  [id number]   being seen today for an initial annual visit and to discuss contraceptive options.  The patient is currently using None for pregnancy prevention. Patient reports she does not want a pregnancy in the next year.  Patient has the following medical conditions has Genital HSV; Asthma; Depression; PTSD (post-traumatic stress disorder) dx'd 2022; Bipolar 1 disorder (HCC) dx'd 2013; Smoker 1 ppd; and Anxiety dx'd 2020 on their problem list.  Chief Complaint  Patient presents with   Annual Exam    PE    Patient reports here for physical. Was on DMPA for a long time and no menses since 02/2020. Last sex 10/06/20 with condom; no partners currently. Last PE 10/2019. Last pap 07/22/16 Phineas Real neg. Smoking 1 ppd. Last MJ 07/2020. Last ETOH 03/08/21 (1 Margarita+3 beers)qo weekend. Working 25 hours/wk and living with her son.  Patient denies cigars or vapinig  Body mass index is 23.62 kg/m. - Patient is eligible for diabetes screening based on BMI and age >31?  not applicable HA1C ordered? not applicable  Patient reports 1  partner/s in last year. Desires STI screening?  Yes  Has patient been screened once for HCV in the past?  No  No results found for: HCVAB  Does the patient have current drug use (including MJ), have a partner with drug use, and/or has been incarcerated since last result? No  If yes-- Screen for HCV through [facility]   Does the patient meet criteria for HBV testing? No  Criteria:  -Household, sexual or needle sharing contact with HBV -History of drug use -HIV positive -Those with known Hep C   Health Maintenance Due  Topic Date Due   COVID-19 Vaccine (1) Never done   Pneumococcal Vaccine 97-6 Years old (1 - PCV) Never done   TETANUS/TDAP  Never done   INFLUENZA  VACCINE  02/17/2021    Review of Systems  All other systems reviewed and are negative.  The following portions of the patient's history were reviewed and updated as appropriate: allergies, current medications, past family history, past medical history, past social history, past surgical history and problem list. Problem list updated.   See flowsheet for other program required questions.  Objective:   Vitals:   03/11/21 1612  BP: 113/68  Weight: 150 lb 12.8 oz (68.4 kg)  Height: 5\' 7"  (1.702 m)    Physical Exam Constitutional:      Appearance: Normal appearance. She is normal weight.  HENT:     Head: Normocephalic and atraumatic.     Mouth/Throat:     Mouth: Mucous membranes are moist.     Comments: Last dental exam 12/2019 Eyes:     Conjunctiva/sclera: Conjunctivae normal.  Neck:     Thyroid: No thyroid mass, thyromegaly or thyroid tenderness.  Cardiovascular:     Rate and Rhythm: Normal rate and regular rhythm.  Pulmonary:     Effort: Pulmonary effort is normal.     Breath sounds: Normal breath sounds.  Chest:  Breasts:    Right: Normal.     Left: Normal.  Abdominal:     Palpations: Abdomen is soft.     Comments: Soft without masses or tenderness, good tone  Genitourinary:    General: Normal vulva.     Exam position: Lithotomy position.  Vagina: Vaginal discharge (white thick leukorrhea, ph>4.5) present.     Cervix: Normal.     Uterus: Normal.      Adnexa: Right adnexa normal and left adnexa normal.     Rectum: Normal.  Musculoskeletal:        General: Normal range of motion.     Cervical back: Normal range of motion and neck supple.  Skin:    General: Skin is warm and dry.  Neurological:     Mental Status: She is alert.  Psychiatric:        Mood and Affect: Mood normal.      Assessment and Plan:  Peggy Landry is a 35 y.o. female presenting to the [facility] Department for an initial annual wellness/contraceptive  visit  Contraception counseling: Reviewed all forms of birth control options in the tiered based approach. available including abstinence; over the counter/barrier methods; hormonal contraceptive medication including pill, patch, ring, injection,contraceptive implant, ECP; hormonal and nonhormonal IUDs; permanent sterilization options including vasectomy and the various tubal sterilization modalities. Risks, benefits, and typical effectiveness rates were reviewed.  Questions were answered.  Written information was also given to the patient to review.  Patient desires nothinig, this was prescribed for patient. She will follow up in  prn for surveillance.  She was told to call with any further questions, or with any concerns about this method of contraception.  Emphasized use of condoms 100% of the time for STI prevention.  Patient was offered ECP. ECP was not accepted by the patient. ECP counseling was not given - see RN documentation  1. Family planning Treat wet mount per standing orders Immunization nurse consult - WET PREP FOR TRICH, YEAST, CLUE - Chlamydia/Gonorrhea [place] [facility] - Syphilis Serology, [employer] [facility] - HIV [place] [facility] - IGP, Aptima HPV - Pregnancy, urine  2. PTSD (post-traumatic stress disorder)   3. Bipolar 1 disorder (HCC) dx'd 2013   4. Smoker 1 ppd Please give 1800 quit line #  5. Anxiety dx'd 2020 Please give Kathreen Cosier # to pt     No follow-ups on file.  No future appointments.  Alberteen Spindle, CNM

## 2021-03-11 NOTE — Progress Notes (Signed)
Pt here for PE.  Pt is not currently on any BC and is refusing any, at this time.  Wet mount results reviewed and medication dispensed per Provider orders. Berdie Ogren, RN

## 2021-03-14 LAB — IGP, APTIMA HPV
HPV Aptima: NEGATIVE
PAP Smear Comment: 0

## 2021-05-06 ENCOUNTER — Other Ambulatory Visit: Payer: Self-pay

## 2021-05-06 ENCOUNTER — Encounter: Payer: Self-pay | Admitting: Dermatology

## 2021-05-06 ENCOUNTER — Ambulatory Visit: Payer: Medicaid Other | Admitting: Dermatology

## 2021-05-06 DIAGNOSIS — L750 Bromhidrosis: Secondary | ICD-10-CM

## 2021-05-06 DIAGNOSIS — L700 Acne vulgaris: Secondary | ICD-10-CM | POA: Diagnosis not present

## 2021-05-06 MED ORDER — CLINDAMYCIN PHOSPHATE 1 % EX LOTN: TOPICAL_LOTION | Freq: Two times a day (BID) | CUTANEOUS | 2 refills | Status: DC

## 2021-05-06 MED ORDER — TRETINOIN 0.025 % EX CREA: TOPICAL_CREAM | Freq: Every day | CUTANEOUS | 2 refills | Status: AC

## 2021-05-06 NOTE — Patient Instructions (Addendum)
Bromhidrosis -  Start clindamycin lotion 1-2 times daily to affected areas.  Start Hibaclens (chlorhexidine) wash once daily allowing to sit for a few minutes before rinsing. Avoid vaginal area, eyes and ears.  Hibaclens is over the counter.   Acne -  Start tretinoin 0.025% cream nightly as directed.   Topical retinoid medications like tretinoin/Retin-A, adapalene/Differin, tazarotene/Fabior, and Epiduo/Epiduo Forte can cause dryness and irritation when first started. Only apply a pea-sized amount to the entire affected area. Avoid applying it around the eyes, edges of mouth and creases at the nose. If you experience irritation, use a good moisturizer first and/or apply the medicine less often. If you are doing well with the medicine, you can increase how often you use it until you are applying every night. Be careful with sun protection while using this medication as it can make you sensitive to the sun. This medicine should not be used by pregnant women.   If you have any questions or concerns for your doctor, please call our main line at [phone] and press option 4 to reach your doctor's medical assistant. If no one answers, please leave a voicemail as directed and we will return your call as soon as possible. Messages left after 4 pm will be answered the following business day.   You may also send [place] a message via MyChart. We typically respond to MyChart messages within 1-2 business days.  For prescription refills, please ask your pharmacy to contact our office. Our fax number is [phone].  If you have an urgent issue when the clinic is closed that cannot wait until the next business day, you can page your doctor at the number below.    Please note that while we do our best to be available for urgent issues outside of office hours, we are not available 24/7.   If you have an urgent issue and are unable to reach [place], you may choose to seek medical care at your doctor's office, retail  clinic, urgent care center, or emergency room.  If you have a medical emergency, please immediately call 911 or go to the emergency department.  Pager Numbers  - Dr. Gwen Pounds: [phone]  - Dr. Neale Burly: [phone]  - Dr. Roseanne Reno: [phone]  In the event of inclement weather, please call our main line at [phone] for an update on the status of any delays or closures.  Dermatology Medication Tips: Please keep the boxes that topical medications come in in order to help keep track of the instructions about where and how to use these. Pharmacies typically print the medication instructions only on the boxes and not directly on the medication tubes.   If your medication is too expensive, please contact our office at [phone] option 4 or send [place] a message through MyChart.   We are unable to tell what your co-pay for medications will be in advance as this is different depending on your insurance coverage. However, we may be able to find a substitute medication at lower cost or fill out paperwork to get insurance to cover a needed medication.   If a prior authorization is required to get your medication covered by your insurance company, please allow [place] 1-2 business days to complete this process.  Drug prices often vary depending on where the prescription is filled and some pharmacies may offer cheaper prices.  The website www.goodrx.com contains coupons for medications through different pharmacies. The prices here do not account for what the cost may be with help from insurance (  it may be cheaper with your insurance), but the website can give you the price if you did not use any insurance.  - You can print the associated coupon and take it with your prescription to the pharmacy.  - You may also stop by our office during regular business hours and pick up a GoodRx coupon card.  - If you need your prescription sent electronically to a different pharmacy, notify our office through [facility] or by phone at [phone] option 4.

## 2021-05-06 NOTE — Progress Notes (Signed)
   Follow-Up Visit   Subjective  Peggy Landry is a 35 y.o. female who presents for the following: Excessive Sweating (Patient was taking Glycopyrrolate but discontinued due to dry mouth. Patient thinks she may have been taking too much. Patient advises she has noticed odor coming from groin area and underarms, as well as strong smell with her urine. She has used PanOxyl but did not improve. ).    The following portions of the chart were reviewed this encounter and updated as appropriate:   Tobacco  Allergies  Meds  Problems  Med Hx  Surg Hx  Fam Hx      Review of Systems:  No other skin or systemic complaints except as noted in HPI or Assessment and Plan.  Objective  Well appearing patient in no apparent distress; mood and affect are within normal limits.  A focused examination was performed including face. Relevant physical exam findings are noted in the Assessment and Plan.  groin, axilla odor  face Trace open comedones, single inflammatory papule   Assessment & Plan  Bromhidrosis groin, axilla  Chronic condition with duration or expected duration over one year. Condition is bothersome to patient. Currently flared.  Primarily affecting groin area.  Start clindamycin lotion 1-2 times daily to affected areas.  Start Hibiclens wash once daily allowing to sit for a few minutes before rinsing. Avoid vaginal area, eyes and ears.   Patient also reports some urine odor. She has tested negative for BV and has had her kidneys checked and was told they were working normally. She also reports drinking large amounts of water to be sure urine is not too concentrated.  She previously took glycopyrrolate for excess sweating but took more than recommended and had dry mouth, concentrated urine and some chest pain. At this time, her primary concern is odor rather than excess sweating.  Reviewed CMP, wet prep, urinalysis results.  If not improved significantly at f/u may consider  diet, medications or other metabolic causes at f/u.   clindamycin (CLEOCIN-T) 1 % lotion - groin, axilla Apply topically 2 (two) times daily.  Acne vulgaris face  Start tretinoin 0.025% cream nightly as directed.   Topical retinoid medications like tretinoin/Retin-A, adapalene/Differin, tazarotene/Fabior, and Epiduo/Epiduo Forte can cause dryness and irritation when first started. Only apply a pea-sized amount to the entire affected area. Avoid applying it around the eyes, edges of mouth and creases at the nose. If you experience irritation, use a good moisturizer first and/or apply the medicine less often. If you are doing well with the medicine, you can increase how often you use it until you are applying every night. Be careful with sun protection while using this medication as it can make you sensitive to the sun. This medicine should not be used by pregnant women.   Can use clindamycin twice a day as needed for inflammatory papules  tretinoin (RETIN-A) 0.025 % cream - face Apply topically at bedtime.  Return in about 3 months (around 08/06/2021) for Acne, bromhidrosis.  Anise Salvo, RMA, am acting as scribe for Darden Dates, MD .  Documentation: I have reviewed the above documentation for accuracy and completeness, and I agree with the above.  Darden Dates, MD

## 2021-05-25 ENCOUNTER — Emergency Department
Admission: EM | Admit: 2021-05-25 | Discharge: 2021-05-25 | Disposition: A | Discharge status: Home / Self Care | Payer: Medicaid Other | Attending: Emergency Medicine | Admitting: Emergency Medicine

## 2021-05-25 ENCOUNTER — Other Ambulatory Visit: Payer: Self-pay

## 2021-05-25 ENCOUNTER — Encounter: Payer: Self-pay | Admitting: Emergency Medicine

## 2021-05-25 ENCOUNTER — Emergency Department: Payer: Medicaid Other

## 2021-05-25 DIAGNOSIS — F17210 Nicotine dependence, cigarettes, uncomplicated: Secondary | ICD-10-CM | POA: Insufficient documentation

## 2021-05-25 DIAGNOSIS — Z20822 Contact with and (suspected) exposure to covid-19: Secondary | ICD-10-CM | POA: Diagnosis not present

## 2021-05-25 DIAGNOSIS — J45909 Unspecified asthma, uncomplicated: Secondary | ICD-10-CM | POA: Diagnosis not present

## 2021-05-25 DIAGNOSIS — B349 Viral infection, unspecified: Secondary | ICD-10-CM | POA: Insufficient documentation

## 2021-05-25 DIAGNOSIS — R059 Cough, unspecified: Secondary | ICD-10-CM | POA: Diagnosis present

## 2021-05-25 LAB — RESP PANEL BY RT-PCR (FLU A&B, COVID) ARPGX2
Influenza A by PCR: NEGATIVE
Influenza B by PCR: NEGATIVE
SARS Coronavirus 2 by RT PCR: NEGATIVE

## 2021-05-25 NOTE — Discharge Instructions (Signed)
Your COVID and influenza test were negative however it still possible that you have another viral illness.  Your chest x-ray did not show any pneumonia.  You can take Tylenol and Motrin for body aches.

## 2021-05-25 NOTE — ED Triage Notes (Signed)
Pt via POV from home, pt c/o cough, fever, chills, back pain, and body ache for about a week. Pt states her son has been sick too. Pt is A&OX4 and NAD.

## 2021-05-25 NOTE — ED Provider Notes (Signed)
[facility]  ____________________________________________   Event Date/Time   First MD Initiated Contact with Patient 05/25/21 1234     (approximate)  I have reviewed the triage vital signs and the nursing notes.   HISTORY  Chief Complaint Cough, Diarrhea, and Fever    HPI Peggy Landry is a 35 y.o. female with past medical history of anxiety and depression who presents with myalgias, cough.  Symptoms have been going on for about a week.  She endorses cough that is nonproductive with some mild dyspnea.  Denies significant chest pain.  She has had decreased appetite with some mild lower abdominal discomfort.  Denies urinary symptoms.  She is still tolerating p.o.  Body aches is her primary complaint which just started today.  Her son has been sick for the last week with influenza A and she is primarily here to have him evaluated for worsening symptoms.         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 No pertinent surgical history.  Prior to Admission medications   Medication Sig Start Date End Date Taking? Authorizing Provider  albuterol (VENTOLIN HFA) 108 (90 Base) MCG/ACT inhaler Inhale 2 puffs into the lungs every 6 (six) hours as needed for wheezing or shortness of breath. 08/24/19   Fisher, Roselyn Bering, PA-C  clindamycin (CLEOCIN-T) 1 % lotion Apply topically 2 (two) times daily. 05/06/21 05/06/22  Moye, [place], MD  glycopyrrolate (ROBINUL) 1 MG tablet TAKING 3 TAB A DAY FOR 2 DAYS THEN CAN INCREASE TO 4 TABS A DAY FOR 4 DAYS, THEN 5 TABS A DAY FOR 4 DAYS, THEN 6 TABS A DAY AS  DIRECTED Patient not taking: Reported on 03/11/2021 06/19/20   Neale Burly, [place], MD  glycopyrrolate (ROBINUL) 1 MG tablet TAKE 1 TAB DAILY X3 DAYS, IF NO PROBLEM AND STILL SWEATING GO TO TWICE DAILY, IF NO PROBLEMS AND STILL SWEATING GO TO 3X DAILY Patient not taking: Reported on 03/11/2021 06/19/20   Neale Burly, [place], MD  glycopyrrolate (ROBINUL) 1 MG tablet Take 3 tabs po in the morning, 2 tabs po mid day, and 3 tabs po in the evening. Patient not taking: Reported on 03/11/2021 07/31/20   Neale Burly, [place], MD  mirtazapine (REMERON) 15 MG tablet Take 15 mg by mouth at bedtime. For depression    [provider]  tretinoin (RETIN-A) 0.025 % cream Apply topically at bedtime. 05/06/21 05/06/22  Sandi Mealy, MD    Allergies Patient has no known allergies.  Family History  Problem Relation Age of Onset   Hypertension Mother    Dementia Father    COPD Father    Heart disease Paternal Grandmother    Breast cancer Neg Hx     Social History Social History   Tobacco Use   Smoking status: Every Day    Packs/day: 1.00    Types: Cigarettes   Smokeless tobacco: Never   Tobacco comments:    Quitline info given and refer to PCP for meds.  Vaping Use   Vaping Use: Never used  Substance Use Topics   Alcohol  use: Yes    Alcohol/week: 4.0 standard drinks    Types: 3 Cans of beer, 1 Standard drinks or equivalent per week    Comment: last use 03/08/21 qo weekend   Drug use: Not Currently    Types: Marijuana, Cocaine    Comment: last use 07/2020    Review of Systems   Review of Systems  Constitutional:  Positive for appetite change and fatigue.  Respiratory:  Positive for cough and shortness of breath.   Cardiovascular:  Negative for chest pain.  Gastrointestinal:  Positive for abdominal pain. Negative for diarrhea, nausea and vomiting.  Genitourinary:  Negative for dysuria.  Musculoskeletal:  Positive for myalgias.  All other systems reviewed and are negative.  Physical Exam Updated  Vital Signs BP 114/78 (BP Location: Right Arm)   Pulse 88   Temp 98.6 F (37 C) (Oral)   Resp 16   Ht 5\' 6"  (1.676 m)   Wt 64.9 kg   SpO2 97%   BMI 23.08 kg/m   Physical Exam Vitals and nursing note reviewed.  Constitutional:      General: She is not in acute distress.    Appearance: Normal appearance.  HENT:     Head: Normocephalic and atraumatic.  Eyes:     General: No scleral icterus.    Conjunctiva/sclera: Conjunctivae normal.  Cardiovascular:     Rate and Rhythm: Normal rate and regular rhythm.  Pulmonary:     Effort: Pulmonary effort is normal. No respiratory distress.     Breath sounds: No stridor.  Abdominal:     General: Abdomen is flat. There is no distension.     Palpations: Abdomen is soft.     Tenderness: There is no abdominal tenderness. There is no guarding.  Musculoskeletal:        General: No deformity or signs of injury.     Cervical back: Normal range of motion.  Skin:    General: Skin is dry.     Coloration: Skin is not jaundiced or pale.  Neurological:     General: No focal deficit present.     Mental Status: She is alert and oriented to person, place, and time. Mental status is at baseline.  Psychiatric:        Mood and Affect: Mood normal.        Behavior: Behavior normal.     LABS (all labs ordered are listed, but only abnormal results are displayed)  Labs Reviewed  RESP PANEL BY RT-PCR (FLU A&B, COVID) [id number]   ____________________________________________  EKG  N/a ____________________________________________  RADIOLOGY , personally viewed and evaluated these images (plain radiographs) as part of my medical decision making, as well as reviewing the written report by the radiologist.  ED MD interpretation:  I reviewed the CXR which does not show any acute cardiopulmonary process      ____________________________________________   PROCEDURES  Procedure(s) performed (including Critical  Care):  Procedures   ____________________________________________   INITIAL IMPRESSION / ASSESSMENT AND PLAN / ED COURSE     35 year old female presents with myalgias cough and abdominal pain.  Vital signs within normal limits.  She is very well-appearing.  Is with her son today and positive for influenza A I suspect that her primary reason for coming today was to have it evaluated but because she is having symptoms also decided to be evaluated.  Abdomen is benign on exam.  She has no urinary symptoms to suggest UTI.  As her cough and mild shortness  of breath I did get a chest x-ray which is negative for infiltrate.  Her COVID and influenza tests are negative.  However I wonder whether this is falsely negative as her son is influenza A positive with her myalgias and cough I would not be surprised if she did test positive in several days.  Advised supportive care with Tylenol and NSAIDs.  Return precautions discussed.      ____________________________________________   FINAL CLINICAL IMPRESSION(S) / ED DIAGNOSES  Final diagnoses:  Viral syndrome     ED Discharge Orders     None        Note:  This document was prepared using Dragon voice recognition software and may include unintentional dictation errors.    Georga Hacking, MD 05/25/21 1539

## 2021-07-02 IMAGING — CR DG CHEST 2V
2 series · 2 of 2 positions shown · non-contrast
Comparison: 09/29/2018

CLINICAL DATA: Chest pain

EXAM:
CHEST - 2 VIEW

[chest pa]
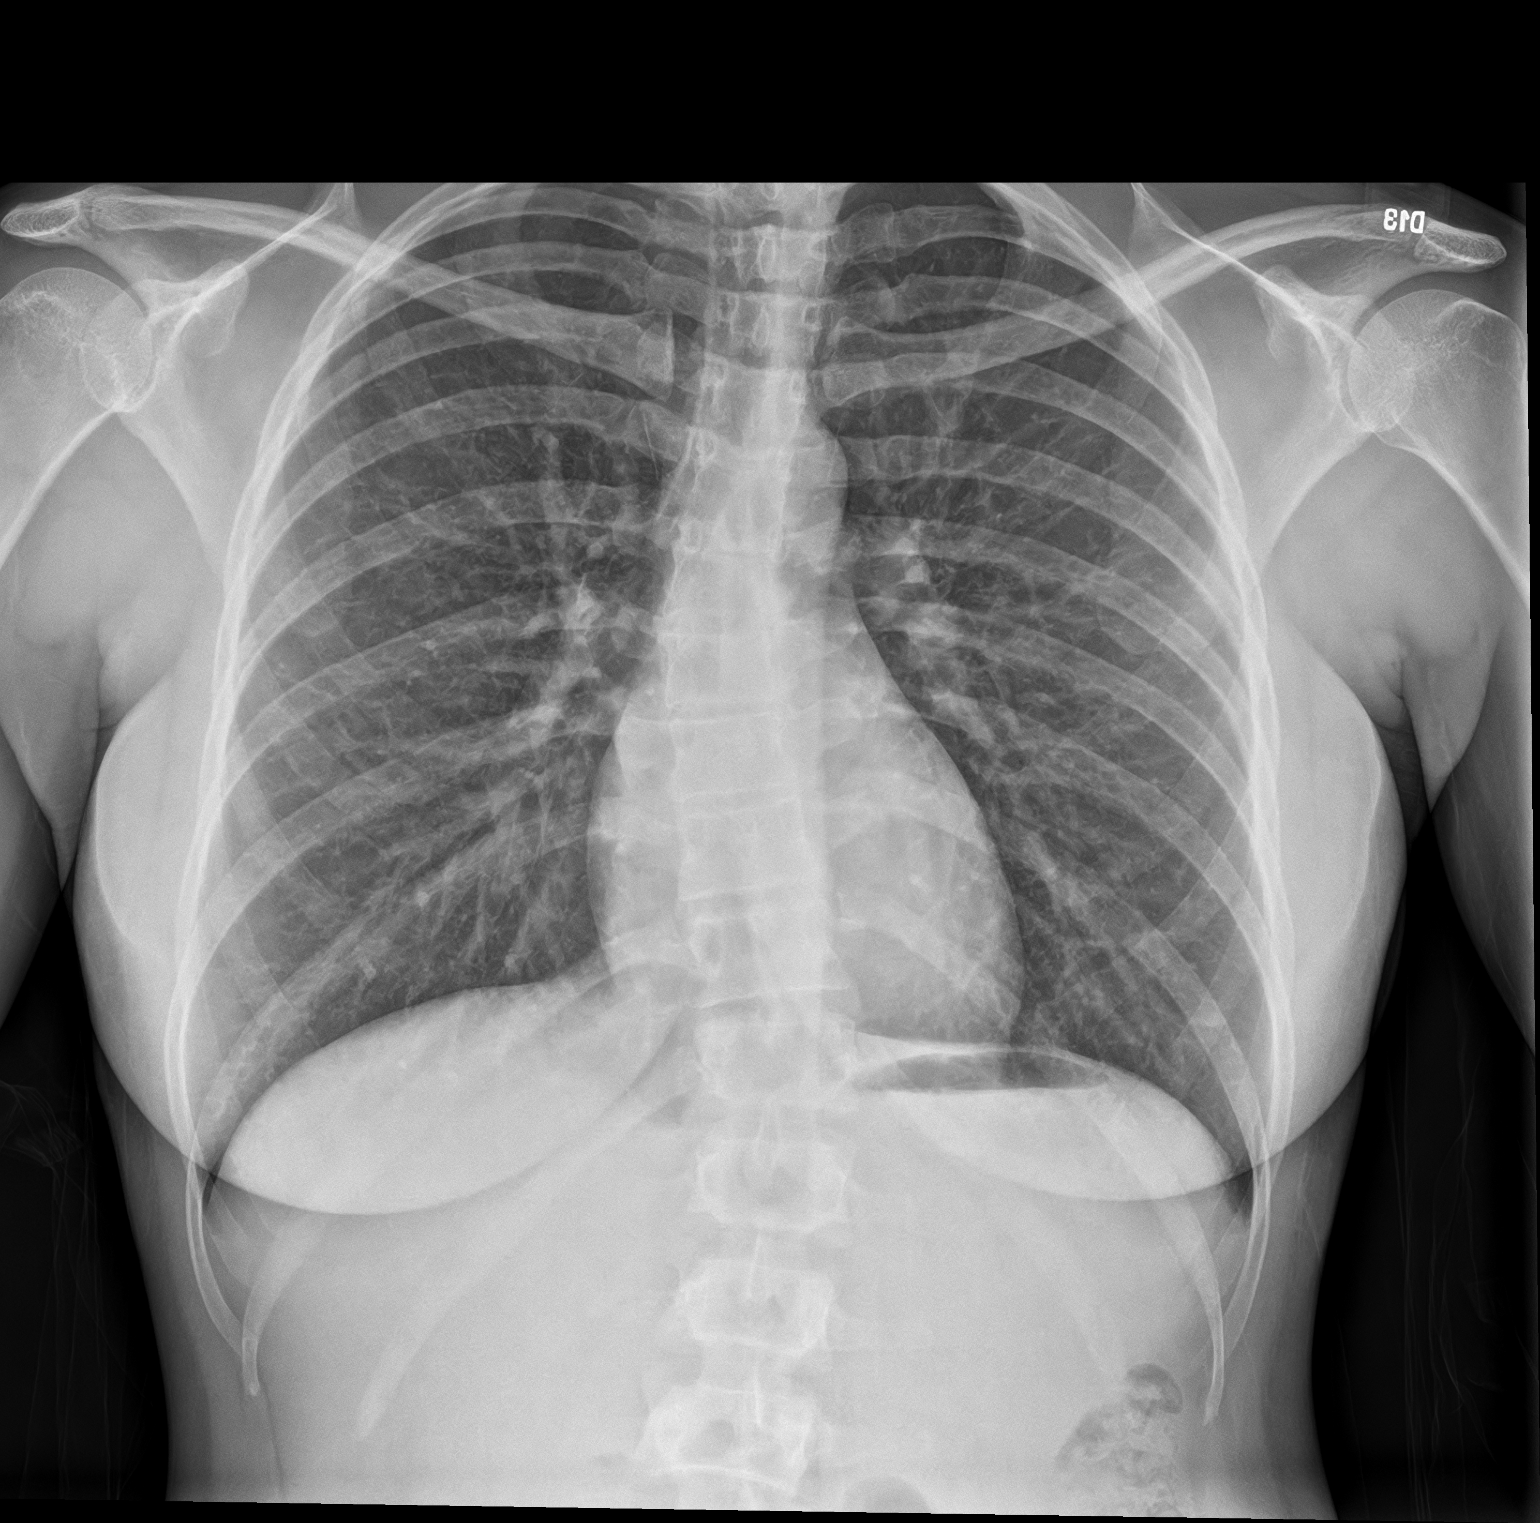

[chest lat]
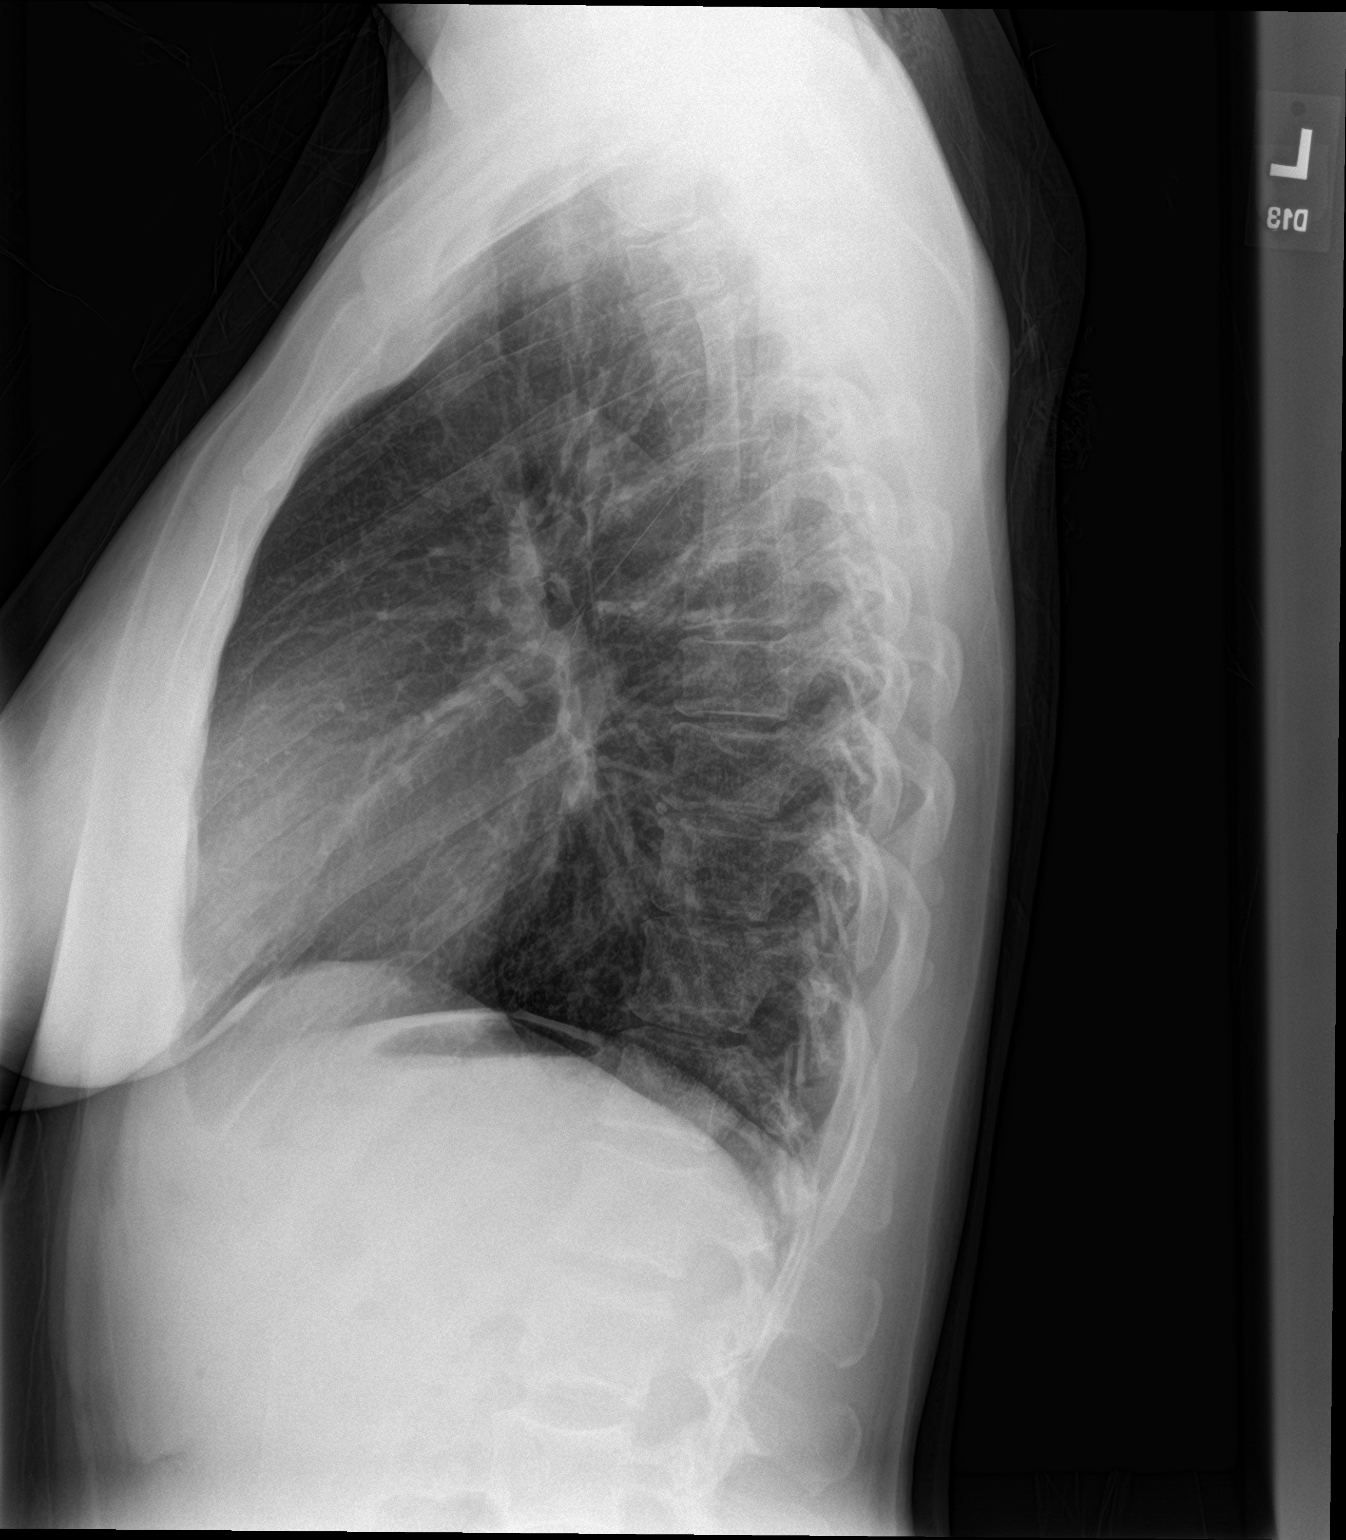

[2 of 2 positions shown; findings below may reference images not displayed]

FINDINGS: The heart size and mediastinal contours are within normal limits.
Both lungs are clear. No pleural effusion or pneumothorax. The
visualized skeletal structures are unremarkable.
IMPRESSION: No acute process in the chest.

## 2021-07-10 ENCOUNTER — Encounter: Payer: Self-pay | Admitting: Internal Medicine

## 2021-08-06 ENCOUNTER — Ambulatory Visit: Payer: Medicaid Other | Admitting: Dermatology

## 2022-07-27 ENCOUNTER — Encounter: Payer: Self-pay | Admitting: Family Medicine

## 2022-07-27 ENCOUNTER — Ambulatory Visit: Payer: Medicaid Other | Admitting: Family Medicine

## 2022-07-27 DIAGNOSIS — Z113 Encounter for screening for infections with a predominantly sexual mode of transmission: Secondary | ICD-10-CM | POA: Diagnosis not present

## 2022-07-27 DIAGNOSIS — B9689 Other specified bacterial agents as the cause of diseases classified elsewhere: Secondary | ICD-10-CM

## 2022-07-27 LAB — WET PREP FOR TRICH, YEAST, CLUE
Trichomonas Exam: NEGATIVE
Yeast Exam: NEGATIVE

## 2022-07-27 LAB — HM HIV SCREENING LAB: HM HIV Screening: NEGATIVE

## 2022-07-27 MED ORDER — METRONIDAZOLE 500 MG PO TABS: 500.0000 mg | ORAL_TABLET | Freq: Two times a day (BID) | ORAL | 0 refills | Status: AC

## 2022-07-27 NOTE — Progress Notes (Signed)
[facility] Department  STI clinic/screening visit [place] [address] [phone]  Subjective:  Peggy Landry is a 37 y.o. female being seen today for an STI screening visit. The patient reports they do have symptoms.  Patient reports that they do not desire a pregnancy in the next year.   They reported they are not interested in discussing contraception today.    Patient's last menstrual period was 07/08/2022 (approximate).   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 States she has gotten BV before and believes this is BV. Uses boric acid supplements. Tried it this time but thinks it made it worse.   Does the patient using douching products? No  Last HIV test per patient/review of record was  Lab Results  Component Value Date   HMHIVSCREEN Negative - Validated 03/20/2020   No results found for: "HIV" Patient reports last pap was 03/11/21    Screening for MPX risk: Does the patient have an unexplained rash? No Is the patient MSM? No Does the patient endorse multiple sex partners or anonymous sex partners? No Did the patient have close or sexual contact with a person diagnosed with MPX? No Has the patient traveled outside the [place] where MPX is endemic? No Is there a high clinical suspicion for MPX-- evidenced by one of the following No  -Unlikely to be chickenpox  -Lymphadenopathy  -Rash that present in same phase of  evolution on any given body part See flowsheet for further details and programmatic requirements.   Immunization history:   There is no immunization history on file for this patient.   The following portions of the patient's history were reviewed and updated as appropriate: allergies, current medications, past medical history, past social history, past surgical history and problem list.  Objective:  There were no vitals filed for this visit.  Physical Exam Vitals and nursing note reviewed.  Constitutional:      Appearance: Normal appearance.  HENT:     Head: Normocephalic and atraumatic.     Mouth/Throat:     Mouth: Mucous membranes are moist.     Pharynx: Oropharynx is clear. No oropharyngeal exudate or posterior oropharyngeal erythema.  Pulmonary:     Effort: Pulmonary effort is normal.  Abdominal:     General: Abdomen is flat.     Palpations: There is no mass.     Tenderness: There is no abdominal tenderness. There is no rebound.  Genitourinary:    Comments: Patient declined genital exam- self swabbed.  Lymphadenopathy:     Head:     Right side of head: No preauricular or posterior auricular adenopathy.     Left side of head: No preauricular or posterior auricular adenopathy.     Cervical: No cervical adenopathy.     Upper Body:     Right upper body: No supraclavicular, axillary or epitrochlear adenopathy.     Left upper body: No  supraclavicular, axillary or epitrochlear adenopathy.  Skin:    General: Skin is warm and dry.     Findings: No rash.  Neurological:     Mental Status: She is alert and oriented to person, place, and time.      Assessment and Plan:  Peggy Landry is a 37 y.o. female presenting to the [facility] Department for STI screening  1. Screening for venereal disease  - WET PREP FOR TRICH, YEAST, CLUE - HIV [facility] - Syphilis Serology, [facility] - Chlamydia/Gonorrhea [facility] - Chlamydia/Gonorrhea [place] [facility] [facility]  2.  BV (bacterial vaginosis) Treated with metronidazole-- patient c/o symptoms, has recurrent BV.   Patient accepted all screenings including oral, vaginal CT/GC and bloodwork for HIV/RPR, and wet prep. Patient meets criteria for HepB screening? No. Ordered? No - not indicated Patient meets criteria for HepC screening? No. Ordered? No - not indicated  Treat wet prep per standing order Discussed time line for [facility] results and that patient will be called with positive results and encouraged patient to call if she had not heard in 2 weeks.  Counseled to return or seek care for continued or worsening symptoms Recommended condom use with all sex  Patient is currently using  female condom  to prevent pregnancy.    Return if symptoms worsen or fail to improve.  No future appointments.  Lenice Llamas, [place]

## 2022-07-27 NOTE — Progress Notes (Signed)
Pt appointment for STI screening. Seen by FNP Lowella Petties. Initial results reviewed with pt, and pt treated for BV per provider assessment and verbal order. Metronidazole dispensed and instructions given.

## 2022-09-08 ENCOUNTER — Encounter: Payer: Self-pay | Admitting: Emergency Medicine

## 2022-09-08 ENCOUNTER — Ambulatory Visit
Admission: EM | Admit: 2022-09-08 | Discharge: 2022-09-08 | Disposition: A | Discharge status: Home / Self Care | Payer: Medicaid Other | Attending: Family Medicine | Admitting: Family Medicine

## 2022-09-08 DIAGNOSIS — B9689 Other specified bacterial agents as the cause of diseases classified elsewhere: Secondary | ICD-10-CM

## 2022-09-08 DIAGNOSIS — N760 Acute vaginitis: Secondary | ICD-10-CM

## 2022-09-08 DIAGNOSIS — B3731 Acute candidiasis of vulva and vagina: Secondary | ICD-10-CM

## 2022-09-08 LAB — URINALYSIS, W/ REFLEX TO CULTURE (INFECTION SUSPECTED)
Bilirubin Urine: NEGATIVE
Glucose, UA: NEGATIVE mg/dL
Hgb urine dipstick: NEGATIVE
Nitrite: NEGATIVE
Protein, ur: NEGATIVE mg/dL
Specific Gravity, Urine: 1.02 (ref 1.005–1.030)
pH: 7 (ref 5.0–8.0)

## 2022-09-08 LAB — WET PREP, GENITAL
Sperm: NONE SEEN
Trich, Wet Prep: NONE SEEN
WBC, Wet Prep HPF POC: NONE SEEN — AB (ref <10)

## 2022-09-08 MED ORDER — FLUCONAZOLE 150 MG PO TABS: 150.0000 mg | ORAL_TABLET | Freq: Every day | ORAL | 0 refills | Status: AC

## 2022-09-08 MED ORDER — METRONIDAZOLE 500 MG PO TABS: 500.0000 mg | ORAL_TABLET | Freq: Two times a day (BID) | ORAL | 0 refills | Status: DC

## 2022-09-08 NOTE — ED Provider Notes (Addendum)
[facility]    CSN: [id number] Arrival date & time: 09/08/22  0957      History   Chief Complaint Chief Complaint  Patient presents with   Abdominal Pain    HPI Peggy Landry is a 37 y.o. female.   Patient presents for evaluation of urinary frequency, urgency, lower abdominal pain occurring intermittently and intermittent right flank pain for 7 days.  Has had a strong urinary odor present for approximately 1 year.  Has not attempted treatment of symptoms.  Last menstrual period approximately February 13, described as normal, menstruation typically occurs monthly, did has vaginal spotting in January 2024 but this has resolved.  No concern for STD.  Denies vaginal discharge, itching, irritation.   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 No pertinent surgical history.  OB History     Gravida  3   Para      Term      Preterm      AB  2   Living  1      SAB      IAB      Ectopic      Multiple      Live Births               Home Medications    Prior to Admission medications   Medication Sig Start Date End Date Taking? Authorizing Provider  albuterol (VENTOLIN HFA) 108 (90 Base) MCG/ACT inhaler Inhale 2 puffs into the lungs every 6 (six) hours as needed for wheezing or shortness of breath. 08/24/19   Fisher, Linden Dolin, PA-C  mirtazapine (REMERON) 15 MG tablet Take 15 mg by mouth at bedtime. For depression    [provider]    Family History Family History  Problem Relation Age of Onset   Hypertension Mother    Dementia Father    COPD Father    Heart disease  Paternal Grandmother    Breast cancer Neg Hx     Social History Social History   Tobacco Use   Smoking status: Every Day    Packs/day: 1.00    Types: Cigarettes   Smokeless tobacco: Never   Tobacco comments:    Quitline info given and refer to PCP for meds.  Vaping Use   Vaping Use: Never used  Substance Use Topics   Alcohol use: Yes    Alcohol/week: 4.0 standard drinks of alcohol    Types: 3 Cans of beer, 1 Standard drinks or equivalent per week    Comment: last use 03/08/21 qo weekend   Drug use: Not Currently    Types: Marijuana, Cocaine    Comment: last use 07/2020     Allergies   Patient has no known allergies.   Review of Systems Review of Systems  Constitutional: Negative.   HENT: Negative.    Respiratory: Negative.    Cardiovascular: Negative.   Genitourinary:  Positive for flank pain, frequency, pelvic pain and urgency. Negative for decreased urine volume, difficulty urinating, dyspareunia, dysuria, enuresis, genital sores, hematuria, menstrual problem, vaginal bleeding, vaginal discharge  and vaginal pain.     Physical Exam Triage Vital Signs ED Triage Vitals  Enc Vitals Group     BP 09/08/22 1037 (!) 120/91     Pulse Rate 09/08/22 1037 82     Resp 09/08/22 1037 16     Temp 09/08/22 1037 98.4 F (36.9 C)     Temp Source 09/08/22 1037 Oral     SpO2 09/08/22 1037 100 %     Weight 09/08/22 1035 143 lb 1.3 oz (64.9 kg)     Height 09/08/22 1035 5' 6"$  (1.676 m)     Head Circumference --      Peak Flow --      Pain Score 09/08/22 1034 7     Pain Loc --      Pain Edu? --      Excl. in [place]? --    No data found.  Updated Vital Signs BP (!) 120/91 (BP Location: Left Arm)   Pulse 82   Temp 98.4 F (36.9 C) (Oral)   Resp 16   Ht 5' 6"$  (1.676 m)   Wt 143 lb 1.3 oz (64.9 kg)   LMP 09/01/2022 (Approximate)   SpO2 100%   BMI 23.09 kg/m   Visual Acuity Right Eye Distance:   Left Eye Distance:   Bilateral Distance:    Right Eye Near:   Left Eye  Near:    Bilateral Near:     Physical Exam Constitutional:      Appearance: She is well-developed.  Eyes:     Extraocular Movements: Extraocular movements intact.  Pulmonary:     Effort: Pulmonary effort is normal.  Abdominal:     General: Abdomen is flat. Bowel sounds are normal.     Palpations: Abdomen is soft.     Tenderness: There is abdominal tenderness in the suprapubic area. There is no right CVA tenderness or left CVA tenderness.  Neurological:     Mental Status: She is alert and oriented to person, place, and time. Mental status is at baseline.      UC Treatments / Results  Labs (all labs ordered are listed, but only abnormal results are displayed) Labs Reviewed  WET PREP, GENITAL - Abnormal; Notable for the following components:      Result Value   Yeast Wet Prep HPF POC PRESENT (*)    Clue Cells Wet Prep HPF POC PRESENT (*)    WBC, Wet Prep HPF POC NONE SEEN (*)    All other components within normal limits  URINALYSIS, W/ REFLEX TO CULTURE (INFECTION SUSPECTED) - Abnormal; Notable for the following components:   Ketones, ur TRACE (*)    Leukocytes,Ua SMALL (*)    Bacteria, UA MANY (*)    All other components within normal limits    EKG   Radiology No results found.  Procedures Procedures (including critical care time)  Medications Ordered in UC Medications - No data to display  Initial Impression / Assessment and Plan / UC Course  I have reviewed the triage vital signs and the nursing notes.  Pertinent labs & imaging results that were available during my care of the patient were reviewed by me and considered in my medical decision making (see chart for details).  Bacterial Vaginosis, yeast vaginitis  Confirmed by wet prep, negative for trichomoniasis urinalysis negative, discussed all findings with patient, prescribed metronidazole and Diflucan, discussed administration, advised abstinence until symptoms have cleared and medication is resolved,  discuss additional support using use of boric acid suppositories  as needed, may follow-up with his urgent care or her gynecologist if symptoms persist or worsen Final Clinical Impressions(s) / UC Diagnoses   Final diagnoses:  Bacterial vaginosis  Yeast vaginitis   Discharge Instructions   None    ED Prescriptions   None    PDMP not reviewed this encounter.   Hans Eden, NP 09/08/22 1134    Hans Eden, [place] 09/08/22 1135

## 2022-09-08 NOTE — Discharge Instructions (Addendum)
Today you are being treated for bacterial vaginosis and yeast, confirmed on your wet prep, negative for trichomoniasis, urinalysis is negative  Take Metronidazole 500 mg twice a day for 7 days, do not drink alcohol while using medication, this will make you feel sick   Take 1 Diflucan tablet when you receive your medicine, then take second dose after completion of all antibiotics.  Bacterial vaginosis which results from an overgrowth of one on several organisms that are normally present in your vagina. Yeast infections which are caused by a fungus called candida complace produced when the vaginal pH is altered.nt.Vaginosis is an inflammation of the vagina that can result in discharge, itching and pain.   In addition: Avoid baths, hot tubs and whirlpool spas.  Don't use scented or harsh soaps Avoid irritants. These include scented tampons and pads. Wipe from front to back after using the toilet. Don't douche. Your vagina doesn't require cleansing other than normal bathing.  Use a condom.  Wear cotton underwear, this fabric absorbs some moisture.

## 2022-09-08 NOTE — ED Triage Notes (Signed)
Pt c/o lower abdominal pain, odorous urine. Pt states she has noticed her urine being strong for a year but other symptoms started about a week ago. She states her urine feels "hot" but no burning. Pt states she gets BV often.

## 2022-09-23 ENCOUNTER — Other Ambulatory Visit: Payer: Self-pay

## 2022-09-23 ENCOUNTER — Encounter: Payer: Self-pay | Admitting: Emergency Medicine

## 2022-09-23 ENCOUNTER — Emergency Department: Payer: Medicaid Other

## 2022-09-23 ENCOUNTER — Emergency Department
Admission: EM | Admit: 2022-09-23 | Discharge: 2022-09-23 | Disposition: A | Discharge status: Home / Self Care | Payer: Medicaid Other | Attending: Emergency Medicine | Admitting: Emergency Medicine

## 2022-09-23 DIAGNOSIS — N390 Urinary tract infection, site not specified: Secondary | ICD-10-CM | POA: Insufficient documentation

## 2022-09-23 DIAGNOSIS — Z87891 Personal history of nicotine dependence: Secondary | ICD-10-CM | POA: Insufficient documentation

## 2022-09-23 DIAGNOSIS — J45909 Unspecified asthma, uncomplicated: Secondary | ICD-10-CM | POA: Diagnosis not present

## 2022-09-23 DIAGNOSIS — Z7951 Long term (current) use of inhaled steroids: Secondary | ICD-10-CM | POA: Insufficient documentation

## 2022-09-23 DIAGNOSIS — R1032 Left lower quadrant pain: Secondary | ICD-10-CM | POA: Diagnosis present

## 2022-09-23 LAB — URINALYSIS, ROUTINE W REFLEX MICROSCOPIC
Bacteria, UA: NONE SEEN
Bilirubin Urine: NEGATIVE
Glucose, UA: NEGATIVE mg/dL
Ketones, ur: NEGATIVE mg/dL
Nitrite: POSITIVE — AB
Protein, ur: >=300 mg/dL — AB
RBC / HPF: >50 RBC/hpf (ref 0–5)
Specific Gravity, Urine: 1.016 (ref 1.005–1.030)
Squamous Epithelial / HPF: NONE SEEN /HPF (ref 0–5)
WBC, UA: >50 WBC/hpf (ref 0–5)
pH: 7 (ref 5.0–8.0)

## 2022-09-23 LAB — CBC WITH DIFFERENTIAL/PLATELET
Abs Immature Granulocytes: 0.03 10*3/uL (ref 0.00–0.07)
Basophils Absolute: 0.1 10*3/uL (ref 0.0–0.1)
Basophils Relative: 1 %
Eosinophils Absolute: 0.1 10*3/uL (ref 0.0–0.5)
Eosinophils Relative: 1 %
HCT: 41.7 % (ref 36.0–46.0)
Hemoglobin: 14.3 g/dL (ref 12.0–15.0)
Immature Granulocytes: 0 %
Lymphocytes Relative: 16 %
Lymphs Abs: 1.7 10*3/uL (ref 0.7–4.0)
MCH: 32.5 pg (ref 26.0–34.0)
MCHC: 34.3 g/dL (ref 30.0–36.0)
MCV: 94.8 fL (ref 80.0–100.0)
Monocytes Absolute: 0.7 10*3/uL (ref 0.1–1.0)
Monocytes Relative: 7 %
Neutro Abs: 8.3 10*3/uL — ABNORMAL HIGH (ref 1.7–7.7)
Neutrophils Relative %: 75 %
Platelets: 174 10*3/uL (ref 150–400)
RBC: 4.4 MIL/uL (ref 3.87–5.11)
RDW: 13.2 % (ref 11.5–15.5)
WBC: 10.9 10*3/uL — ABNORMAL HIGH (ref 4.0–10.5)
nRBC: 0 % (ref 0.0–0.2)

## 2022-09-23 LAB — COMPREHENSIVE METABOLIC PANEL
ALT: 13 U/L (ref 0–44)
AST: 19 U/L (ref 15–41)
Albumin: 4 g/dL (ref 3.5–5.0)
Alkaline Phosphatase: 31 U/L — ABNORMAL LOW (ref 38–126)
Anion gap: 9 (ref 5–15)
BUN: 10 mg/dL (ref 6–20)
CO2: 22 mmol/L (ref 22–32)
Calcium: 8.8 mg/dL — ABNORMAL LOW (ref 8.9–10.3)
Chloride: 103 mmol/L (ref 98–111)
Creatinine, Ser: 0.69 mg/dL (ref 0.44–1.00)
GFR, Estimated: >60 mL/min (ref >60)
Glucose, Bld: 118 mg/dL — ABNORMAL HIGH (ref 70–99)
Potassium: 3.7 mmol/L (ref 3.5–5.1)
Sodium: 134 mmol/L — ABNORMAL LOW (ref 135–145)
Total Bilirubin: 0.6 mg/dL (ref 0.3–1.2)
Total Protein: 6.6 g/dL (ref 6.5–8.1)

## 2022-09-23 LAB — PREGNANCY, URINE: Preg Test, Ur: NEGATIVE

## 2022-09-23 LAB — LIPASE, BLOOD: Lipase: 29 U/L (ref 11–51)

## 2022-09-23 MED ORDER — SODIUM CHLORIDE 0.9 % IV SOLN
1.0000 g | Freq: Once | INTRAVENOUS | Status: AC
  Administered 2022-09-23: 1 g via INTRAVENOUS
  Filled 2022-09-23: qty 10

## 2022-09-23 MED ORDER — CEPHALEXIN 500 MG PO CAPS: 500.0000 mg | ORAL_CAPSULE | Freq: Three times a day (TID) | ORAL | 0 refills | Status: AC

## 2022-09-23 MED ORDER — MORPHINE SULFATE (PF) 4 MG/ML IV SOLN
4.0000 mg | Freq: Once | INTRAVENOUS | Status: AC
  Administered 2022-09-23: 4 mg via INTRAVENOUS
  Filled 2022-09-23: qty 1

## 2022-09-23 MED ORDER — MORPHINE SULFATE (PF) 4 MG/ML IV SOLN
4.0000 mg | INTRAVENOUS | Status: DC | PRN
  Administered 2022-09-23: 4 mg via INTRAVENOUS
  Filled 2022-09-23: qty 1

## 2022-09-23 MED ORDER — KETOROLAC TROMETHAMINE 30 MG/ML IJ SOLN
15.0000 mg | Freq: Once | INTRAMUSCULAR | Status: AC
  Administered 2022-09-23: 15 mg via INTRAVENOUS
  Filled 2022-09-23: qty 1

## 2022-09-23 MED ORDER — ONDANSETRON HCL 4 MG/2ML IJ SOLN
4.0000 mg | Freq: Once | INTRAMUSCULAR | Status: AC
  Administered 2022-09-23: 2 mg via INTRAVENOUS
  Filled 2022-09-23: qty 2

## 2022-09-23 MED ORDER — SODIUM CHLORIDE 0.9 % IV BOLUS
1000.0000 mL | Freq: Once | INTRAVENOUS | Status: AC
  Administered 2022-09-23: 1000 mL via INTRAVENOUS

## 2022-09-23 MED ORDER — IOHEXOL 300 MG/ML  SOLN
100.0000 mL | Freq: Once | INTRAMUSCULAR | Status: AC | PRN
  Administered 2022-09-23: 100 mL via INTRAVENOUS

## 2022-09-23 NOTE — ED Notes (Signed)
Patient transported to CT 

## 2022-09-23 NOTE — ED Triage Notes (Signed)
Patient ambulatory to triage with steady gait, without difficulty or distress noted; pt reports left lower abd pain with painful, diff urinating x wk

## 2022-09-23 NOTE — ED Provider Notes (Signed)
[facility] Provider Note    Event Date/Time   First MD Initiated Contact with Patient 09/23/22 [phone]     (approximate)   History   Abdominal Pain   HPI  Peggy Landry is a 37 y.o. female who presents to the ED from home with a chief complaint of left sided abdominal pain.  Patient had pain last week, saw her PCP who treated her for BV and a yeast infection.  Pain recurred tonight with tingling sensation when she urinates.  Denies fever/chills, cough, chest pain, shortness of breath, nausea, vomiting or diarrhea.  Never history of kidney stones, ovarian cysts or diverticulitis.     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 No pertinent surgical history.   Home Medications   Prior to Admission medications   Medication Sig Start Date End Date Taking? Authorizing Provider  albuterol (VENTOLIN HFA) 108 (90 Base) MCG/ACT inhaler Inhale 2 puffs into the lungs every 6 (six) hours as needed for wheezing or shortness of breath. 08/24/19   Fisher, Linden Dolin, PA-C  metroNIDAZOLE (FLAGYL) 500 MG tablet Take 1 tablet (500 mg total) by mouth 2 (two) times daily. 09/08/22   Hans Eden, NP  mirtazapine (REMERON) 15 MG tablet Take 15 mg by mouth at bedtime. For depression    [provider]     Allergies  Patient has no known allergies.   Family History   Family History  Problem Relation Age of Onset   Hypertension Mother    Dementia Father    COPD Father    Heart disease Paternal Grandmother    Breast  cancer Neg Hx      Physical Exam  Triage Vital Signs: ED Triage Vitals  Enc Vitals Group     BP 09/23/22 0517 117/83     Pulse Rate 09/23/22 0517 90     Resp 09/23/22 0517 18     Temp 09/23/22 0517 98.3 F (36.8 C)     Temp Source 09/23/22 0517 Oral     SpO2 09/23/22 0517 98 %     Weight 09/23/22 0513 156 lb (70.8 kg)     Height 09/23/22 0513 '5\' 6"'$  (1.676 m)     Head Circumference --      Peak Flow --      Pain Score 09/23/22 0513 8     Pain Loc --      Pain Edu? --      Excl. in [place]? --     Updated Vital Signs: BP 117/83 (BP Location: Right Arm)   Pulse 90   Temp 98.3 F (36.8 C) (Oral)   Resp 18   Ht '5\' 6"'$  (1.676 m)   Wt 70.8 kg   LMP 09/01/2022 (Approximate)   SpO2 98%   BMI 25.18 kg/m    General: Awake, mild distress.  CV:  RRR.  Good peripheral perfusion.  Resp:  Normal effort.  CTAB. Abd:  Mildly tender left lateral and  lower quadrants without rebound or guarding.  No CVAT.  No distention.  Other:  No truncal vesicles.   ED Results / Procedures / Treatments  Labs (all labs ordered are listed, but only abnormal results are displayed) Labs Reviewed  CBC WITH DIFFERENTIAL/PLATELET - Abnormal; Notable for the following components:      Result Value   WBC 10.9 (*)    Neutro Abs 8.3 (*)    All other components within normal limits  COMPREHENSIVE METABOLIC PANEL - Abnormal; Notable for the following components:   Sodium 134 (*)    Glucose, Bld 118 (*)    Calcium 8.8 (*)    Alkaline Phosphatase 31 (*)    All other components within normal limits  URINALYSIS, ROUTINE W REFLEX MICROSCOPIC - Abnormal; Notable for the following components:   Color, Urine YELLOW (*)    APPearance TURBID (*)    Hgb urine dipstick SMALL (*)    Protein, ur >=300 (*)    Nitrite POSITIVE (*)    Leukocytes,Ua LARGE (*)    All other components within normal limits  URINE CULTURE  LIPASE, BLOOD  PREGNANCY, URINE  POC URINE PREG, ED     EKG  None   RADIOLOGY  CT  abdomen/pelvis pending   Official radiology report(s): No results found.   PROCEDURES:  Critical Care performed: No  .1-3 Lead EKG Interpretation  Performed by: Paulette Blanch, MD Authorized by: Paulette Blanch, MD     Interpretation: normal     ECG rate:  90   ECG rate assessment: normal     Rhythm: sinus rhythm     Ectopy: none     Conduction: normal   Comments:     Patient placed on cardiac monitor to evaluate for arrhythmias    MEDICATIONS ORDERED IN ED: Medications  cefTRIAXone (ROCEPHIN) 1 g in sodium chloride 0.9 % 100 mL IVPB (has no administration in time range)  sodium chloride 0.9 % bolus 1,000 mL (1,000 mLs Intravenous New Bag/Given 09/23/22 0605)  ondansetron (ZOFRAN) injection 4 mg (2 mg Intravenous Given 09/23/22 0606)  morphine (PF) 4 MG/ML injection 4 mg (4 mg Intravenous Given 09/23/22 0606)     IMPRESSION / MDM / ASSESSMENT AND PLAN / ED COURSE  I reviewed the triage vital signs and the nursing notes.                             37 year old female presenting with left-sided abdominal pain. Differential diagnosis includes, but is not limited to, ovarian cyst, ovarian torsion, acute appendicitis, diverticulitis, urinary tract infection/pyelonephritis, endometriosis, bowel obstruction, colitis, renal colic, gastroenteritis, hernia, fibroids, endometriosis, pregnancy related pain including ectopic pregnancy, etc. I have personally reviewed patient's records and note her urgent care visit on 09/08/2022 for bacterial vaginosis and yeast vaginitis.  Patient's presentation is most consistent with acute presentation with potential threat to life or bodily function.  Will obtain lab work, UA.  Obtain CT abdomen/pelvis to evaluate for etiology of patient's symptoms.  Initiate IV fluid hydration, IV morphine for pain.  With IV Zofran for nausea.  Will reassess.  Clinical Course as of 09/23/22 0705  Wed Sep 23, 2022  0705 Bladder scan minimal residual.  Updated patient on  laboratory urine results.  Have ordered IV Rocephin.  Care transferred to Dr. Quentin Cornwall at change of shift pending CT scan.  Anticipate patient may be discharged home on oral antibiotics. [JS]    Clinical Course User  Index [JS] Paulette Blanch, MD     FINAL CLINICAL IMPRESSION(S) / ED DIAGNOSES   Final diagnoses:  Left lower quadrant abdominal pain  Lower urinary tract infectious disease     Rx / DC Orders   ED Discharge Orders     None        Note:  This document was prepared using Dragon voice recognition software and may include unintentional dictation errors.   Paulette Blanch, MD 09/23/22 [phone]

## 2022-09-23 NOTE — ED Provider Notes (Signed)
Patient received in signout from Dr. Nancy Fetter pending follow-up CT imaging.  CT imaging on my review and interpretation does not show any evidence of obstruction.  Per radiology report there is no sign of appendicitis.  Patient likely has some mild early pyelonephritis.  She is not septic.  She is tolerating p.o.  Has received a dose of IV antibiotics.  Does appear stable and appropriate for outpatient management.  We discussed return precautions.   Merlyn Lot, MD 09/23/22 [phone]

## 2022-09-23 NOTE — ED Notes (Signed)
Pt returned from CT °

## 2022-09-25 LAB — URINE CULTURE: Culture: >=100000 — AB

## 2022-10-02 ENCOUNTER — Other Ambulatory Visit: Payer: Self-pay

## 2022-10-02 ENCOUNTER — Emergency Department
Admission: EM | Admit: 2022-10-02 | Discharge: 2022-10-02 | Disposition: A | Discharge status: Home / Self Care | Payer: Medicaid Other | Attending: Emergency Medicine | Admitting: Emergency Medicine

## 2022-10-02 ENCOUNTER — Encounter: Payer: Self-pay | Admitting: Emergency Medicine

## 2022-10-02 DIAGNOSIS — Z711 Person with feared health complaint in whom no diagnosis is made: Secondary | ICD-10-CM | POA: Insufficient documentation

## 2022-10-02 LAB — URINALYSIS, ROUTINE W REFLEX MICROSCOPIC
Bilirubin Urine: NEGATIVE
Glucose, UA: NEGATIVE mg/dL
Hgb urine dipstick: NEGATIVE
Ketones, ur: NEGATIVE mg/dL
Leukocytes,Ua: NEGATIVE
Nitrite: NEGATIVE
Protein, ur: NEGATIVE mg/dL
Specific Gravity, Urine: 1.014 (ref 1.005–1.030)
pH: 7 (ref 5.0–8.0)

## 2022-10-02 LAB — CBC WITH DIFFERENTIAL/PLATELET
Abs Immature Granulocytes: 0.01 10*3/uL (ref 0.00–0.07)
Basophils Absolute: 0.1 10*3/uL (ref 0.0–0.1)
Basophils Relative: 1 %
Eosinophils Absolute: 0.2 10*3/uL (ref 0.0–0.5)
Eosinophils Relative: 3 %
HCT: 41.8 % (ref 36.0–46.0)
Hemoglobin: 14 g/dL (ref 12.0–15.0)
Immature Granulocytes: 0 %
Lymphocytes Relative: 44 %
Lymphs Abs: 3.3 10*3/uL (ref 0.7–4.0)
MCH: 31.7 pg (ref 26.0–34.0)
MCHC: 33.5 g/dL (ref 30.0–36.0)
MCV: 94.8 fL (ref 80.0–100.0)
Monocytes Absolute: 0.5 10*3/uL (ref 0.1–1.0)
Monocytes Relative: 7 %
Neutro Abs: 3.3 10*3/uL (ref 1.7–7.7)
Neutrophils Relative %: 45 %
Platelets: 207 10*3/uL (ref 150–400)
RBC: 4.41 MIL/uL (ref 3.87–5.11)
RDW: 13 % (ref 11.5–15.5)
WBC: 7.4 10*3/uL (ref 4.0–10.5)
nRBC: 0 % (ref 0.0–0.2)

## 2022-10-02 LAB — COMPREHENSIVE METABOLIC PANEL
ALT: 16 U/L (ref 0–44)
AST: 18 U/L (ref 15–41)
Albumin: 4.1 g/dL (ref 3.5–5.0)
Alkaline Phosphatase: 44 U/L (ref 38–126)
Anion gap: 8 (ref 5–15)
BUN: 13 mg/dL (ref 6–20)
CO2: 25 mmol/L (ref 22–32)
Calcium: 9.2 mg/dL (ref 8.9–10.3)
Chloride: 104 mmol/L (ref 98–111)
Creatinine, Ser: 0.83 mg/dL (ref 0.44–1.00)
GFR, Estimated: >60 mL/min (ref >60)
Glucose, Bld: 98 mg/dL (ref 70–99)
Potassium: 3.5 mmol/L (ref 3.5–5.1)
Sodium: 137 mmol/L (ref 135–145)
Total Bilirubin: 0.5 mg/dL (ref 0.3–1.2)
Total Protein: 7 g/dL (ref 6.5–8.1)

## 2022-10-02 LAB — POC URINE PREG, ED: Preg Test, Ur: NEGATIVE

## 2022-10-02 NOTE — ED Triage Notes (Signed)
Patient to ED for abd pain- but no pain at this time. Seen here for same last week and given a prescription for kidney infection. Patient has finished prescription and wants to check to see if infection is gone. Also states that her "thumbs are dark."

## 2022-10-02 NOTE — ED Provider Notes (Signed)
[facility] Provider Note  Patient Contact: 5:20 PM (approximate)   History   Abdominal Pain   HPI  Peggy Landry is a 37 y.o. female presents to the emergency department to have a repeat urinalysis.  Patient was previously diagnosed with a UTI and she reports that her symptoms have resolved.  She denies fever, low back pain, dysuria, hematuria or increased urinary frequency.      Physical Exam   Triage Vital Signs: ED Triage Vitals [10/02/22 1622]  Enc Vitals Group     BP (!) 139/90     Pulse Rate 95     Resp 18     Temp 98.3 F (36.8 C)     Temp Source Oral     SpO2 100 %     Weight      Height      Head Circumference      Peak Flow      Pain Score 0     Pain Loc      Pain Edu?      Excl. in [place]?     Most recent vital signs: Vitals:   10/02/22 1622  BP: (!) 139/90  Pulse: 95  Resp: 18  Temp: 98.3 F (36.8 C)  SpO2: 100%     General: Alert and in no acute distress. Eyes:  PERRL. EOMI. Head: No acute traumatic findings ENT:      Nose: No congestion/rhinnorhea.      Mouth/Throat: Mucous membranes are moist. Neck: No stridor. No cervical spine tenderness to palpation. Cardiovascular:  Good peripheral perfusion Respiratory: Normal respiratory effort without tachypnea or retractions. Lungs CTAB. Good air entry to the bases with no decreased or absent breath sounds. Gastrointestinal: Bowel sounds 4 quadrants. Soft and nontender to palpation. No guarding or rigidity. No palpable masses. No distention. No CVA tenderness. Musculoskeletal: Full range of motion to all extremities.  Neurologic:  No gross focal neurologic deficits are appreciated.  Skin:   No rash noted   ED Results / Procedures / Treatments   Labs (all labs ordered are listed, but only abnormal results are displayed) Labs Reviewed  URINALYSIS, ROUTINE W REFLEX MICROSCOPIC - Abnormal; Notable for the following components:      Result Value   Color, Urine YELLOW  (*)    APPearance CLEAR (*)    All other components within normal limits  CBC WITH DIFFERENTIAL/PLATELET  COMPREHENSIVE METABOLIC PANEL  POC URINE PREG, ED        PROCEDURES:  Critical Care performed: No  Procedures   MEDICATIONS ORDERED IN ED: Medications - No data to display   IMPRESSION / MDM / [place] / ED COURSE  I reviewed the triage vital signs and the nursing notes.                              Assessment and plan Feared complaint without diagnosis 37 year old female presents to the emergency department to be reassessed after being diagnosed with a UTI.  CBC, CMP and urinalysis reassuring.  Patient is appropriate for outpatient follow-up.  All patient questions were answered.      FINAL CLINICAL IMPRESSION(S) / ED DIAGNOSES   Final diagnoses:  Feared complaint without diagnosis     Rx / DC Orders   ED Discharge Orders     None        Note:  This document was prepared using Dragon voice recognition software  and may include unintentional dictation errors.   Vallarie Mare [place], PA-C 10/02/22 1722    Lavonia Drafts, MD 10/02/22 Vernelle Emerald

## 2022-11-13 ENCOUNTER — Ambulatory Visit
Admission: RE | Admit: 2022-11-13 | Discharge: 2022-11-13 | Disposition: A | Discharge status: Home / Self Care | Payer: Medicaid Other | Source: Ambulatory Visit | Attending: Emergency Medicine | Admitting: Emergency Medicine

## 2022-11-13 ENCOUNTER — Telehealth: Payer: Self-pay | Admitting: Emergency Medicine

## 2022-11-13 VITALS — BP 118/84 | HR 76 | Temp 98.9°F | Ht 66.0 in | Wt 153.0 lb

## 2022-11-13 DIAGNOSIS — R350 Frequency of micturition: Secondary | ICD-10-CM | POA: Diagnosis not present

## 2022-11-13 LAB — WET PREP, GENITAL
Sperm: NONE SEEN
Trich, Wet Prep: NONE SEEN
WBC, Wet Prep HPF POC: <10 (ref <10)

## 2022-11-13 LAB — URINALYSIS, W/ REFLEX TO CULTURE (INFECTION SUSPECTED)
Bilirubin Urine: NEGATIVE
Glucose, UA: NEGATIVE mg/dL
Hgb urine dipstick: NEGATIVE
Ketones, ur: NEGATIVE mg/dL
Leukocytes,Ua: NEGATIVE
Nitrite: NEGATIVE
Protein, ur: NEGATIVE mg/dL
Specific Gravity, Urine: 1.02 (ref 1.005–1.030)
pH: 6.5 (ref 5.0–8.0)

## 2022-11-13 MED ORDER — METRONIDAZOLE 0.75 % VA GEL: 1.0000 | Freq: Every day | VAGINAL | 0 refills | Status: DC

## 2022-11-13 MED ORDER — FLUCONAZOLE 150 MG PO TABS: 150.0000 mg | ORAL_TABLET | Freq: Every day | ORAL | 0 refills | Status: AC

## 2022-11-13 NOTE — ED Provider Notes (Signed)
[facility]    CSN: [id number] Arrival date & time: 11/13/22  1500      History   Chief Complaint Chief Complaint  Patient presents with   Urinary Frequency    UTI - Entered by patient    HPI Peggy Landry is a 37 y.o. female.   Patient presents for evaluation of urinary frequency, urgency and a urinary odor present for 7 days.  Took a home [facility] UTI test which was positive for leukocytes and nitrates.  Has been taking apple cider vinegar supplements, cranberry, probiotics and tolerated which has helped to minimize symptoms.  Sexually active, no known exposure, no concern for STD.  Denies fevers, abdominal pain, flank pain, vaginal symptoms.    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 No pertinent surgical history.  OB History     Gravida  3   Para      Term      Preterm      AB  2   Living  1      SAB      IAB      Ectopic      Multiple      Live Births               Home Medications    Prior to Admission medications   Medication Sig Start Date End Date Taking? Authorizing Provider  albuterol (VENTOLIN HFA) 108 (90 Base) MCG/ACT inhaler Inhale 2 puffs into the lungs every 6 (six) hours as needed for wheezing or shortness of breath. 08/24/19   Fisher, Roselyn Bering, PA-C  metroNIDAZOLE (FLAGYL) 500 MG tablet Take 1 tablet (500 mg total) by mouth 2 (two) times daily. 09/08/22   Valinda Hoar, NP  mirtazapine (REMERON) 15 MG tablet Take 15 mg by mouth at bedtime. For depression    [provider]    Family History Family History  Problem Relation Age of Onset    Hypertension Mother    Dementia Father    COPD Father    Heart disease Paternal Grandmother    Breast cancer Neg Hx     Social History Social History   Tobacco Use   Smoking status: Every Day    Packs/day: 1    Types: Cigarettes   Smokeless tobacco: Never   Tobacco comments:    Quitline info given and refer to PCP for meds.  Vaping Use   Vaping Use: Never used  Substance Use Topics   Alcohol use: Yes    Alcohol/week: 4.0 standard drinks of alcohol    Types: 3 Cans of beer, 1 Standard drinks or equivalent per week    Comment: last use 03/08/21 qo weekend   Drug use: Not Currently    Types: Marijuana, Cocaine    Comment: last use 07/2020     Allergies   Patient has no known allergies.   Review of Systems Review of Systems  Constitutional: Negative.   Respiratory: Negative.    Cardiovascular: Negative.   Gastrointestinal: Negative.   Genitourinary:  Positive for frequency and  urgency. Negative for decreased urine volume, difficulty urinating, dyspareunia, dysuria, enuresis, flank pain, genital sores, hematuria, menstrual problem, pelvic pain, vaginal bleeding, vaginal discharge and vaginal pain.     Physical Exam Triage Vital Signs ED Triage Vitals  Enc Vitals Group     BP 11/13/22 1512 118/84     Pulse Rate 11/13/22 1512 76     Resp --      Temp 11/13/22 1512 98.9 F (37.2 C)     Temp Source 11/13/22 1512 Oral     SpO2 11/13/22 1512 100 %     Weight 11/13/22 1510 153 lb (69.4 kg)     Height 11/13/22 1510 5\' 6"  (1.676 m)     Head Circumference --      Peak Flow --      Pain Score 11/13/22 1510 0     Pain Loc --      Pain Edu? --      Excl. in GC? --    No data found.  Updated Vital Signs BP 118/84 (BP Location: Left Arm)   Pulse 76   Temp 98.9 F (37.2 C) (Oral)   Ht 5\' 6"  (1.676 m)   Wt 153 lb (69.4 kg)   LMP 10/19/2022   SpO2 100%   BMI 24.69 kg/m   Visual Acuity Right Eye Distance:   Left Eye Distance:   Bilateral Distance:    Right Eye  Near:   Left Eye Near:    Bilateral Near:     Physical Exam Constitutional:      Appearance: Normal appearance.  Eyes:     Extraocular Movements: Extraocular movements intact.  Pulmonary:     Effort: Pulmonary effort is normal.  Genitourinary:    Comments: deferred Neurological:     Mental Status: She is alert and oriented to person, place, and time. Mental status is at baseline.      UC Treatments / Results  Labs (all labs ordered are listed, but only abnormal results are displayed) Labs Reviewed  URINALYSIS, W/ REFLEX TO CULTURE (INFECTION SUSPECTED) - Abnormal; Notable for the following components:      Result Value   Bacteria, UA FEW (*)    All other components within normal limits    EKG   Radiology No results found.  Procedures Procedures (including critical care time)  Medications Ordered in UC Medications - No data to display  Initial Impression / Assessment and Plan / UC Course  I have reviewed the triage vital signs and the nursing notes.  Pertinent labs & imaging results that were available during my care of the patient were reviewed by me and considered in my medical decision making (see chart for details).  Urinary frequency  Urinalysis is negative, discussed with patient, wet prep pending, will treat per protocol, advised continued use of supportive measures and if testing today is negative advise follow-up with PCP or gynecologist for reevaluation Final Clinical Impressions(s) / UC Diagnoses   Final diagnoses:  None   Discharge Instructions   None    ED Prescriptions   None    PDMP not reviewed this encounter.   Valinda Hoar, NP 11/13/22 1546

## 2022-11-13 NOTE — Discharge Instructions (Addendum)
Urinalysis today is negative for infection, no leukocytes or nitrates are seen which are consistent with germs and urine  Wet prep checking for yeast, back.  37-year-old vaginosis and trichomoniasis is pending, you will be notified of test results via telephone and treatment sent to pharmacy if needed  You may continue use of Pyridium to help to minimize your symptoms  If all testing today is negative and you continue to have symptoms please follow-up with your gynecologist or primary doctor for reevaluation

## 2022-11-13 NOTE — Telephone Encounter (Signed)
Discussed results of wet prep via telephone, 2 patient identifiers used, MetroGel and Diflucan sent to pharmacy, discussed administration, may follow-up with urgent care as needed

## 2022-11-13 NOTE — ED Triage Notes (Signed)
Pt c/o possibe UTI x1week  Pt was seen in [facility] for a kidney infection and given IV antibiotics.   Pt took a home urine test and was positive for nitrates and leukocytes.   Pt states that her urine has an odor and is taking a OTC medication for her urine.

## 2022-11-22 ENCOUNTER — Other Ambulatory Visit: Payer: Self-pay

## 2022-11-22 ENCOUNTER — Emergency Department
Admission: EM | Admit: 2022-11-22 | Discharge: 2022-11-22 | Disposition: A | Discharge status: Home / Self Care | Payer: Medicaid Other | Attending: Emergency Medicine | Admitting: Emergency Medicine

## 2022-11-22 DIAGNOSIS — N3001 Acute cystitis with hematuria: Secondary | ICD-10-CM | POA: Diagnosis not present

## 2022-11-22 DIAGNOSIS — R1030 Lower abdominal pain, unspecified: Secondary | ICD-10-CM | POA: Diagnosis present

## 2022-11-22 DIAGNOSIS — N390 Urinary tract infection, site not specified: Secondary | ICD-10-CM

## 2022-11-22 LAB — PREGNANCY, URINE: Preg Test, Ur: NEGATIVE

## 2022-11-22 LAB — URINALYSIS, ROUTINE W REFLEX MICROSCOPIC
Bilirubin Urine: NEGATIVE
Glucose, UA: NEGATIVE mg/dL
Ketones, ur: NEGATIVE mg/dL
Nitrite: NEGATIVE
Protein, ur: NEGATIVE mg/dL
Specific Gravity, Urine: 1.009 (ref 1.005–1.030)
WBC, UA: >50 WBC/hpf (ref 0–5)
pH: 6 (ref 5.0–8.0)

## 2022-11-22 LAB — COMPREHENSIVE METABOLIC PANEL
ALT: 15 U/L (ref 0–44)
AST: 24 U/L (ref 15–41)
Albumin: 4.5 g/dL (ref 3.5–5.0)
Alkaline Phosphatase: 38 U/L (ref 38–126)
Anion gap: 5 (ref 5–15)
BUN: 9 mg/dL (ref 6–20)
CO2: 27 mmol/L (ref 22–32)
Calcium: 9.2 mg/dL (ref 8.9–10.3)
Chloride: 104 mmol/L (ref 98–111)
Creatinine, Ser: 0.86 mg/dL (ref 0.44–1.00)
GFR, Estimated: >60 mL/min (ref >60)
Glucose, Bld: 84 mg/dL (ref 70–99)
Potassium: 3.6 mmol/L (ref 3.5–5.1)
Sodium: 136 mmol/L (ref 135–145)
Total Bilirubin: 0.8 mg/dL (ref 0.3–1.2)
Total Protein: 7.2 g/dL (ref 6.5–8.1)

## 2022-11-22 LAB — LIPASE, BLOOD: Lipase: 31 U/L (ref 11–51)

## 2022-11-22 LAB — CBC
HCT: 41.4 % (ref 36.0–46.0)
Hemoglobin: 13.9 g/dL (ref 12.0–15.0)
MCH: 32.4 pg (ref 26.0–34.0)
MCHC: 33.6 g/dL (ref 30.0–36.0)
MCV: 96.5 fL (ref 80.0–100.0)
Platelets: 170 10*3/uL (ref 150–400)
RBC: 4.29 MIL/uL (ref 3.87–5.11)
RDW: 13.2 % (ref 11.5–15.5)
WBC: 6.4 10*3/uL (ref 4.0–10.5)
nRBC: 0 % (ref 0.0–0.2)

## 2022-11-22 MED ORDER — FLUCONAZOLE 100 MG PO TABS: 100.0000 mg | ORAL_TABLET | Freq: Every day | ORAL | 0 refills | Status: DC

## 2022-11-22 MED ORDER — CEFDINIR 300 MG PO CAPS: 300.0000 mg | ORAL_CAPSULE | Freq: Two times a day (BID) | ORAL | 0 refills | Status: AC

## 2022-11-22 NOTE — ED Notes (Signed)
Urine was sent with out POC.Called lab to inform changing order to urine preg so they can run.

## 2022-11-22 NOTE — ED Triage Notes (Signed)
Pt c/o abdominal pain that radiates to her back with painful urination x1 week. Pt denies any n/v or diarrhea. Pt reports she was recently tx 1 month ago for a kidney infection.

## 2022-11-22 NOTE — Discharge Instructions (Addendum)
Please avoid baths and being submerged in water for at least the next 2 weeks.

## 2022-11-22 NOTE — ED Provider Notes (Signed)
[facility] Provider Note   Event Date/Time   First MD Initiated Contact with Patient 11/22/22 1040     (approximate) History  Abdominal Pain and Back Pain  HPI Peggy Landry is a 37 y.o. female with a stated past medical history of recurrent urinary tract infection over the last month who presents complaining of suprapubic abdominal pain, dysuria, and low back pain.  Patient states that her suprapubic abdominal pain with dysuria is been present over the last week after finishing a course of metronidazole and intravaginal metronidazole gel.  Patient states that she began having lower back pain beginning last night and has been worsening since onset.  Pain is currently 6/10 in severity ROS: Patient currently denies any vision changes, tinnitus, difficulty speaking, facial droop, sore throat, chest pain, shortness of breath, nausea/vomiting/diarrhea, or weakness/numbness/paresthesias in any extremity   Physical Exam  Triage Vital Signs: ED Triage Vitals  Enc Vitals Group     BP 11/22/22 0943 119/81     Pulse Rate 11/22/22 0943 (!) 101     Resp 11/22/22 0943 16     Temp 11/22/22 0943 98.5 F (36.9 C)     Temp Source 11/22/22 0943 Oral     SpO2 11/22/22 0943 100 %     Weight 11/22/22 0944 153 lb (69.4 kg)     Height 11/22/22 0944 5\' 6"  (1.676 m)     Head Circumference --      Peak Flow --      Pain Score 11/22/22 0944 6     Pain Loc --      Pain Edu? --      Excl. in GC? --    Most recent vital signs: Vitals:   11/22/22 0943  BP: 119/81  Pulse: (!) 101  Resp: 16  Temp: 98.5 F (36.9 C)  SpO2: 100%   General: Awake, oriented x4. CV:  Good peripheral perfusion.  Resp:  Normal effort.  Abd:  No distention.  Other:  Middle-aged well-developed, well-nourished African-American female laying in stretcher in no acute distress ED Results / Procedures / Treatments  Labs (all labs ordered are listed, but only abnormal results are displayed) Labs  Reviewed  URINALYSIS, ROUTINE W REFLEX MICROSCOPIC - Abnormal; Notable for the following components:      Result Value   Color, Urine YELLOW (*)    APPearance HAZY (*)    Hgb urine dipstick SMALL (*)    Leukocytes,Ua LARGE (*)    Bacteria, UA FEW (*)    All other components within normal limits  LIPASE, BLOOD  COMPREHENSIVE METABOLIC PANEL  CBC  PREGNANCY, URINE   PROCEDURES: Critical Care performed: No Procedures MEDICATIONS ORDERED IN ED: Medications - No data to display IMPRESSION / MDM / ASSESSMENT AND PLAN / ED COURSE  I reviewed the triage vital signs and the nursing notes.                             The patient is on the cardiac monitor to evaluate for evidence of arrhythmia and/or significant heart rate changes. Patient's presentation is most consistent with acute presentation with potential threat to life or bodily function. Not Pregnant. Unlikely TOA, Ovarian Torsion, PID, gonorrhea/chlamydia. Low suspicion for Infected Urolithiasis, AAA, Cholecystitis, Pancreatitis, SBO, Appendicitis, or other acute abdomen.  Rx: Cefdinir 300 mg BID for 5 days Patient instructed to not take baths or submerge in water for the next 2 weeks as  this may be the reason for her recurrent UTIs/bacterial vaginosis Disposition: Discharge home. SRP discussed. Advise follow up with primary care provider within 24-72 hours.   FINAL CLINICAL IMPRESSION(S) / ED DIAGNOSES   Final diagnoses:  Urinary tract infection with hematuria, site unspecified   Rx / DC Orders   ED Discharge Orders          Ordered    cefdinir (OMNICEF) 300 MG capsule  2 times daily        11/22/22 1102           Note:  This document was prepared using Dragon voice recognition software and may include unintentional dictation errors.   Merwyn Katos, MD 11/22/22 [phone]

## 2023-03-09 ENCOUNTER — Emergency Department
Admission: EM | Admit: 2023-03-09 | Discharge: 2023-03-09 | Disposition: A | Discharge status: Home / Self Care | Payer: Medicaid Other | Attending: Emergency Medicine | Admitting: Emergency Medicine

## 2023-03-09 ENCOUNTER — Other Ambulatory Visit: Payer: Self-pay

## 2023-03-09 DIAGNOSIS — N760 Acute vaginitis: Secondary | ICD-10-CM | POA: Diagnosis not present

## 2023-03-09 DIAGNOSIS — N3001 Acute cystitis with hematuria: Secondary | ICD-10-CM | POA: Diagnosis not present

## 2023-03-09 DIAGNOSIS — B9689 Other specified bacterial agents as the cause of diseases classified elsewhere: Secondary | ICD-10-CM | POA: Insufficient documentation

## 2023-03-09 DIAGNOSIS — R109 Unspecified abdominal pain: Secondary | ICD-10-CM | POA: Diagnosis present

## 2023-03-09 LAB — CBC
HCT: 42.1 % (ref 36.0–46.0)
Hemoglobin: 14.2 g/dL (ref 12.0–15.0)
MCH: 32.6 pg (ref 26.0–34.0)
MCHC: 33.7 g/dL (ref 30.0–36.0)
MCV: 96.6 fL (ref 80.0–100.0)
Platelets: 171 10*3/uL (ref 150–400)
RBC: 4.36 MIL/uL (ref 3.87–5.11)
RDW: 12.9 % (ref 11.5–15.5)
WBC: 8.2 10*3/uL (ref 4.0–10.5)
nRBC: 0 % (ref 0.0–0.2)

## 2023-03-09 LAB — URINALYSIS, ROUTINE W REFLEX MICROSCOPIC
Bilirubin Urine: NEGATIVE
Glucose, UA: NEGATIVE mg/dL
Ketones, ur: NEGATIVE mg/dL
Nitrite: NEGATIVE
Protein, ur: NEGATIVE mg/dL
Specific Gravity, Urine: 1.014 (ref 1.005–1.030)
WBC, UA: >50 WBC/hpf (ref 0–5)
pH: 7 (ref 5.0–8.0)

## 2023-03-09 LAB — COMPREHENSIVE METABOLIC PANEL
ALT: 12 U/L (ref 0–44)
AST: 17 U/L (ref 15–41)
Albumin: 4.2 g/dL (ref 3.5–5.0)
Alkaline Phosphatase: 39 U/L (ref 38–126)
Anion gap: 10 (ref 5–15)
BUN: 10 mg/dL (ref 6–20)
CO2: 25 mmol/L (ref 22–32)
Calcium: 9.6 mg/dL (ref 8.9–10.3)
Chloride: 102 mmol/L (ref 98–111)
Creatinine, Ser: 0.9 mg/dL (ref 0.44–1.00)
GFR, Estimated: >60 mL/min (ref >60)
Glucose, Bld: 92 mg/dL (ref 70–99)
Potassium: 3.8 mmol/L (ref 3.5–5.1)
Sodium: 137 mmol/L (ref 135–145)
Total Bilirubin: 0.5 mg/dL (ref 0.3–1.2)
Total Protein: 7 g/dL (ref 6.5–8.1)

## 2023-03-09 LAB — POC URINE PREG, ED: Preg Test, Ur: NEGATIVE

## 2023-03-09 MED ORDER — CEPHALEXIN 500 MG PO CAPS: 500.0000 mg | ORAL_CAPSULE | Freq: Four times a day (QID) | ORAL | 0 refills | Status: AC

## 2023-03-09 MED ORDER — METRONIDAZOLE 500 MG PO TABS: 500.0000 mg | ORAL_TABLET | Freq: Two times a day (BID) | ORAL | 0 refills | Status: AC

## 2023-03-09 NOTE — ED Triage Notes (Signed)
Reports bilateral flank and lower back pain x4 days. Reports painful urination since yesterday. States hx of kidney infections and that her back feels the same as it did at that time. Denies n/v or fevers. Ambulatory to triage. Alert and oriented following commands. Breathing unlabored speaking in full sentences.

## 2023-03-09 NOTE — ED Provider Notes (Signed)
[facility] Provider Note  Patient Contact: 9:27 PM (approximate)   History   Flank Pain   HPI  Peggy Landry is a 37 y.o. female presents to the emergency department with concern for possible UTI and BV.  Patient reports that she struggles with recurrent BV and has been taking boric acid with no improvement.  She states that she has low back pain but no fever, chills, nausea or vomiting.  No dysuria or increased urinary frequency.  She does state that her current symptoms feel like a urinary tract infection that she is experienced in the past.  No prior history of nephrolithiasis.      Physical Exam   Triage Vital Signs: ED Triage Vitals  Encounter Vitals Group     BP 03/09/23 1923 138/87     Systolic BP Percentile --      Diastolic BP Percentile --      Pulse Rate 03/09/23 1923 86     Resp 03/09/23 1923 18     Temp 03/09/23 1923 98.2 F (36.8 C)     Temp Source 03/09/23 1923 Oral     SpO2 03/09/23 1923 100 %     Weight 03/09/23 1922 152 lb (68.9 kg)     Height 03/09/23 1922 5\' 7"  (1.702 m)     Head Circumference --      Peak Flow --      Pain Score 03/09/23 1922 8     Pain Loc --      Pain Education --      Exclude from Growth Chart --     Most recent vital signs: Vitals:   03/09/23 1923  BP: 138/87  Pulse: 86  Resp: 18  Temp: 98.2 F (36.8 C)  SpO2: 100%     General: Alert and in no acute distress. Eyes:  PERRL. EOMI. Head: No acute traumatic findings ENT:      Nose: No congestion/rhinnorhea.      Mouth/Throat: Mucous membranes are moist. Neck: No stridor. No cervical spine tenderness to palpation.  Cardiovascular:  Good peripheral perfusion Respiratory: Normal respiratory effort without tachypnea or retractions. Lungs CTAB. Good air entry to the bases with no decreased or absent breath sounds. Gastrointestinal: Bowel sounds 4 quadrants. Soft and nontender to palpation. No guarding or rigidity. No palpable masses. No  distention. No CVA tenderness. Musculoskeletal: Full range of motion to all extremities.  Neurologic:  No gross focal neurologic deficits are appreciated.  Skin:   No rash noted Other:   ED Results / Procedures / Treatments   Labs (all labs ordered are listed, but only abnormal results are displayed) Labs Reviewed  URINALYSIS, ROUTINE W REFLEX MICROSCOPIC - Abnormal; Notable for the following components:      Result Value   Color, Urine YELLOW (*)    APPearance CLOUDY (*)    Hgb urine dipstick SMALL (*)    Leukocytes,Ua LARGE (*)    Bacteria, UA FEW (*)    All other components within normal limits  CBC  COMPREHENSIVE METABOLIC PANEL  POC URINE PREG, ED         PROCEDURES:  Critical Care performed: No  Procedures   MEDICATIONS ORDERED IN ED: Medications - No data to display   IMPRESSION / MDM / ASSESSMENT AND PLAN / ED COURSE  I reviewed the triage vital signs and the nursing notes.  Assessment and plan UTI BV 37 year old female presents to the emergency department with concern for UTI and BV.  Vital signs are reassuring at triage.  On exam, patient was alert and nontoxic-appearing.  CBC and CMP reassuring.  Urinalysis consistent with UTI with large leukocytes and bacteria.  Will treat patient empirically for both BV and a UTI with Flagyl and Keflex.  Return precautions were given to return with new or worsening symptoms.  All patient questions were answered.     FINAL CLINICAL IMPRESSION(S) / ED DIAGNOSES   Final diagnoses:  Acute cystitis with hematuria  Bacterial vaginosis     Rx / DC Orders   ED Discharge Orders          Ordered    cephALEXin (KEFLEX) 500 MG capsule  4 times daily        03/09/23 2124    metroNIDAZOLE (FLAGYL) 500 MG tablet  2 times daily        03/09/23 2124             Note:  This document was prepared using Dragon voice recognition software and may include unintentional dictation  errors.   Gasper Lloyd 03/09/23 2130    Sharman Cheek, MD 03/09/23 [phone]

## 2023-03-09 NOTE — Discharge Instructions (Signed)
You can take Flagyl twice daily for the next 7 days. You can take Keflex 4 times daily for the next 7 days.

## 2023-04-19 ENCOUNTER — Ambulatory Visit (INDEPENDENT_AMBULATORY_CARE_PROVIDER_SITE_OTHER): Payer: Medicaid Other | Admitting: Obstetrics

## 2023-04-19 ENCOUNTER — Other Ambulatory Visit (HOSPITAL_COMMUNITY)
Admission: RE | Admit: 2023-04-19 | Discharge: 2023-04-19 | Disposition: A | Discharge status: Home / Self Care | Payer: Medicaid Other | Source: Ambulatory Visit | Attending: Obstetrics | Admitting: Obstetrics

## 2023-04-19 ENCOUNTER — Encounter: Payer: Self-pay | Admitting: Obstetrics

## 2023-04-19 VITALS — BP 117/88 | HR 73 | Resp 16 | Ht 66.0 in | Wt 158.1 lb

## 2023-04-19 DIAGNOSIS — N898 Other specified noninflammatory disorders of vagina: Secondary | ICD-10-CM | POA: Diagnosis not present

## 2023-04-19 DIAGNOSIS — N368 Other specified disorders of urethra: Secondary | ICD-10-CM

## 2023-04-19 DIAGNOSIS — B9689 Other specified bacterial agents as the cause of diseases classified elsewhere: Secondary | ICD-10-CM

## 2023-04-19 MED ORDER — METRONIDAZOLE 0.75 % VA GEL
1.0000 | Freq: Every day | VAGINAL | 0 refills | Status: DC
Start: 2023-04-19 — End: 2023-06-15

## 2023-04-19 NOTE — Progress Notes (Signed)
    GYNECOLOGY PROGRESS NOTE  Subjective:    Patient ID: Peggy Landry, female    DOB: Feb 05, 1986, 37 y.o.   MRN: [id number]  HPI  Patient is a 37 y.o. [id number] female who presents for evaluation of recurrent bacterial vaginosis. She was recently evaluated at ED for UTI and bacterial vaginosis. She stated during that time that  she struggles with recurrent BV and has been taking boric acid with no improvement. She states that she has low back pain but no fever, chills, nausea or vomiting. No dysuria or increased urinary frequency. She does state that her current symptoms feel like a urinary tract infection that she is experienced in the past. No prior history of nephrolithiasis. Labs were done at ED and Urinalysis was consistent with UTI with large leukocytes and bacteria. She was treated empirically for both BV and a UTI with Flagyl and Keflex. Today she reports that she continues to have BV and she continues to treat symptoms with MetroGel. Patient's last menstrual period was 04/05/2023. She reports that it was abnormal and has been for the past 2 months.   She c/o ongoing struggles with malodor. She is not sexually active since about 3 months. Her previous partner sharea HSV with her. She is not contracepting. Has been seen at the ED for UTIs and BV on several occasions. Peggy Landry has tried special vinegar washes, using boric acid,Metronidazole and always urinates after intercourse. She does share that she holds her urine too long at times  She has a rash underneath her breast that itches. She has used hydrocortisone cream and it helped.   The following portions of the patient's history were reviewed and updated as appropriate: allergies, current medications, past family history, past medical history, past social history, past surgical history, and problem list.  Review of Systems Pertinent items noted in HPI and remainder of comprehensive ROS otherwise negative.   Objective:   Blood pressure  117/88, pulse 73, resp. rate 16, height 5\' 6"  (1.676 m), weight 158 lb 1.6 oz (71.7 kg), last menstrual period 04/05/2023. Body mass index is 25.52 kg/m. General appearance: alert, cooperative, and no distress Abdomen: soft, non-tender; bowel sounds normal; no masses,  no organomegaly Pelvic: cervix normal in appearance, external genitalia normal, no adnexal masses or tenderness, no cervical motion tenderness, rectovaginal septum normal, uterus normal size, shape, and consistency, and At her introitus , the urethra appears as "beadlike" , protruding slightly  outward.  No external lesions or areas of irritation. She has a moderate amount of white , non malodorous vaginal discharge. Extremities: extremities normal, atraumatic, no cyanosis or edema Neurologic: Grossly normal   Assessment:   1. Vaginal odor    2. 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  Plan:   Discussed continuing suppressive therapy with Metrogel. Will renew her Rx. Aptima swab sent today. Will put in a referral to Urogyn to evaluate the ? Urethral prolapse and to discuss recurrent BV and UTIs.  Mirna Mires, CNM  04/19/2023 11:44 AM

## 2023-04-20 LAB — CERVICOVAGINAL ANCILLARY ONLY
Bacterial Vaginitis (gardnerella): POSITIVE — AB
Candida Glabrata: NEGATIVE
Candida Vaginitis: POSITIVE — AB
Comment: NEGATIVE
Comment: NEGATIVE
Comment: NEGATIVE

## 2023-04-21 ENCOUNTER — Encounter: Payer: Self-pay | Admitting: Obstetrics

## 2023-04-21 ENCOUNTER — Other Ambulatory Visit: Payer: Self-pay | Admitting: Obstetrics

## 2023-04-21 DIAGNOSIS — B3731 Acute candidiasis of vulva and vagina: Secondary | ICD-10-CM

## 2023-04-21 MED ORDER — FLUCONAZOLE 100 MG PO TABS: 100.0000 mg | ORAL_TABLET | Freq: Every day | ORAL | 0 refills | Status: DC

## 2023-05-18 ENCOUNTER — Ambulatory Visit: Payer: Medicaid Other | Admitting: Obstetrics

## 2023-05-18 DIAGNOSIS — R350 Frequency of micturition: Secondary | ICD-10-CM

## 2023-05-18 NOTE — Progress Notes (Deleted)
New Patient Evaluation and Consultation  Referring Provider: Mirna Mires, CNM PCP: [facility] Date of Service: 05/18/2023  SUBJECTIVE Chief Complaint: No chief complaint on file.  History of Present Illness: Peggy Landry is a 37 y.o. {ED SANE [phone] female seen in consultation at the request of {Dr, PA, [id number]} Peggy Landry for evaluation of ***.    History of recurrent BV s/p Boric acid without improvement Multiple ED evaluations: - 03/09/23 flank pain with possible UTI and BV, denies dysuria, urinary frequency. UA + heme/leuk. Rx Keflex and flagyl - 11/22/22 suprapubic pain, dysuria, low back pain. UA + leuk. Rx cefdinir 11/13/22 negative UA  ***Review of records significant for: History of HSV 04/19/23 + BV and candida    Urinary Symptoms: {urine leakage?:24754} Leaks *** time(s) per {days/wks/mos/yrs:310907}.  Pad use: {NUMBERS 1-10:18281} {pad option:24752} per day.   Patient {ACTION; IS/IS QIO:96295284} bothered by UI symptoms.  Day time voids ***.  Nocturia: *** times per night to void. Voiding dysfunction:  {empties:24755} bladder well.  Patient {DOES NOT does:27190::"does not"} use a catheter to empty bladder.  When urinating, patient feels {urine symptoms:24756} Drinks: *** per day  UTIs: {NUMBERS 1-10:18281} UTI's in the last year.   {ACTIONS;DENIES/REPORTS:21021675::"Denies"} history of {urologic concerns:24757} No results found for the last 90 days.   Pelvic Organ Prolapse Symptoms:                  Patient {denies/ admits to:24761} a feeling of a bulge the vaginal area. It has been present for {NUMBER 1-10:22536} {days/wks/mos/yrs:310907}.  Patient {denies/ admits to:24761} seeing a bulge.  This bulge {ACTION; IS/IS XLK:44010272} bothersome.  Bowel Symptom: Bowel movements: *** time(s) per {Time; day/week/month:13537} Stool consistency: {stool consistency:24758} Straining: {yes/no:19897}.  Splinting: {yes/no:19897}.   Incomplete evacuation: {yes/no:19897}.  Patient {denies/ admits to:24761} accidental bowel leakage / fecal incontinence  Occurs: *** time(s) per {Time; day/week/month:13537}  Consistency with leakage: {stool consistency:24758} Bowel regimen: {bowel regimen:24759} Last colonoscopy: Date ***, Results *** [facility] Colonoscopy     This patient has no relevant Health Maintenance data.       Sexual Function Sexually active: {yes/no:19897}.  Sexual orientation: {Sexual Orientation:[phone]} Pain with sex: {pain with sex:24762}  Pelvic Pain {denies/ admits to:24761} pelvic pain Location: *** Pain occurs: *** Prior pain treatment: *** Improved by: *** Worsened by: ***   Past Medical History:  Past Medical History:  Diagnosis Date   Anxiety    Depression    Herpes genitalis in women      Past Surgical History:  No past surgical history on file.   Past OB/GYN History: OB History  Gravida Para Term Preterm AB Living  3       2 1   SAB IAB Ectopic Multiple Live Births               # Outcome Date GA Lbr Len/2nd Weight Sex Type Anes PTL Lv  3 Gravida           2 AB           1 AB             Vaginal deliveries: ***,  Forceps/ Vacuum deliveries: ***, Cesarean section: *** Menopausal: {menopausal:24763} Contraception: ***. Last pap smear was ***.  Any history of abnormal pap smears: {yes/no:19897}. No results found for: "DIAGPAP", "HPVHIGH", "ADEQPAP"  Medications: Patient has a current medication list which includes the following prescription(s): albuterol, fluconazole, metronidazole, and mirtazapine.   Allergies: Patient has No Known Allergies.   Social  History:  Social History   Tobacco Use   Smoking status: Every Day    Current packs/day: 1.00    Types: Cigarettes   Smokeless tobacco: Never   Tobacco comments:    Quitline info given and refer to PCP for meds.  Vaping Use   Vaping status: Never Used  Substance Use Topics   Alcohol use: Yes    Alcohol/week:  4.0 standard drinks of alcohol    Types: 3 Cans of beer, 1 Standard drinks or equivalent per week    Comment: last use 03/08/21 qo weekend   Drug use: Not Currently    Types: Marijuana, Cocaine    Comment: last use 07/2020    Relationship status: {relationship status:24764} Patient lives with ***.   Patient {ACTION; IS/IS XBJ:47829562} employed ***. Regular exercise: {Yes/No:304960894} History of abuse: {Yes/No:304960894}PTSD  Family History:   Family History  Problem Relation Age of Onset   Hypertension Mother    Dementia Father    COPD Father    Heart disease Paternal Grandmother    Breast cancer Neg Hx      Review of Systems: ROS   OBJECTIVE Physical Exam: There were no vitals filed for this visit.  Physical Exam   GU / Detailed Urogynecologic Evaluation:  Pelvic Exam: Normal external female genitalia; Bartholin's and Skene's glands normal in appearance; urethral meatus normal in appearance, no urethral masses or discharge.   CST: {gen negative/positive:315881}  Reflexes: bulbocavernosis {DESC; PRESENT/NOT PRESENT:21021351}, anocutaneous {DESC; PRESENT/NOT PRESENT:21021351} ***bilaterally.  Speculum exam reveals normal vaginal mucosa {With/Without:20273} atrophy. Cervix {exam; gyn cervix:30847}. Uterus {exam; pelvic uterus:30849}. Adnexa {exam; adnexa:12223}.    s/p hysterectomy: Speculum exam reveals normal vaginal mucosa {With/Without:20273}  atrophy and normal vaginal cuff.  Adnexa {exam; adnexa:12223}.    With apex supported, anterior compartment defect was {reduced:24765}  Pelvic floor strength {Roman # I-V:19040}/V, puborectalis {Roman # I-V:19040}/V external anal sphincter {Roman # I-V:19040}/V  Pelvic floor musculature: Right levator {Tender/Non-tender:20250}, Right obturator {Tender/Non-tender:20250}, Left levator {Tender/Non-tender:20250}, Left obturator {Tender/Non-tender:20250}  POP-Q:   POP-Q                                               Aa                                                Ba                                                 C                                                Gh                                               Pb  tvl                                                Ap                                               Bp                                                 D      Rectal Exam:  Normal sphincter tone, {rectocele:24766} distal rectocele, enterocoele {DESC; PRESENT/NOT PRESENT:21021351}, no rectal masses, {sign of:24767} dyssynergia when asking the patient to bear down.  Post-Void Residual (PVR) by Bladder Scan: In order to evaluate bladder emptying, we discussed obtaining a postvoid residual and patient agreed to this procedure.  Procedure: The ultrasound unit was placed on the patient's abdomen in the suprapubic region after the patient had voided.      Laboratory Results: Lab Results  Component Value Date   BILIRUBINUR NEGATIVE 03/09/2023   PROTEINUR NEGATIVE 03/09/2023   LEUKOCYTESUR LARGE (A) 03/09/2023    Lab Results  Component Value Date   CREATININE 0.90 03/09/2023   CREATININE 0.86 11/22/2022   CREATININE 0.83 10/02/2022    No results found for: "HGBA1C"  Lab Results  Component Value Date   HGB 14.2 03/09/2023     ASSESSMENT AND PLAN Peggy Landry is a 37 y.o. with:  1. Urinary frequency       Loleta Chance, MD

## 2023-06-15 ENCOUNTER — Other Ambulatory Visit (HOSPITAL_COMMUNITY)
Admission: RE | Admit: 2023-06-15 | Discharge: 2023-06-15 | Disposition: A | Discharge status: Home / Self Care | Payer: Medicaid Other | Attending: Obstetrics | Admitting: Obstetrics

## 2023-06-15 ENCOUNTER — Encounter: Payer: Self-pay | Admitting: Obstetrics

## 2023-06-15 ENCOUNTER — Other Ambulatory Visit (HOSPITAL_COMMUNITY)
Admission: RE | Admit: 2023-06-15 | Discharge: 2023-06-15 | Disposition: A | Discharge status: Home / Self Care | Payer: Medicaid Other | Source: Other Acute Inpatient Hospital | Attending: Obstetrics | Admitting: Obstetrics

## 2023-06-15 ENCOUNTER — Telehealth: Payer: Self-pay

## 2023-06-15 ENCOUNTER — Other Ambulatory Visit (HOSPITAL_COMMUNITY): Admission: RE | Admit: 2023-06-15 | Payer: Medicaid Other

## 2023-06-15 ENCOUNTER — Ambulatory Visit (INDEPENDENT_AMBULATORY_CARE_PROVIDER_SITE_OTHER): Payer: Medicaid Other | Admitting: Obstetrics

## 2023-06-15 VITALS — BP 120/82 | HR 76 | Ht 66.2 in | Wt 152.0 lb

## 2023-06-15 DIAGNOSIS — N898 Other specified noninflammatory disorders of vagina: Secondary | ICD-10-CM | POA: Diagnosis not present

## 2023-06-15 DIAGNOSIS — R350 Frequency of micturition: Secondary | ICD-10-CM | POA: Insufficient documentation

## 2023-06-15 DIAGNOSIS — Z87440 Personal history of urinary (tract) infections: Secondary | ICD-10-CM

## 2023-06-15 DIAGNOSIS — F17200 Nicotine dependence, unspecified, uncomplicated: Secondary | ICD-10-CM

## 2023-06-15 DIAGNOSIS — B9689 Other specified bacterial agents as the cause of diseases classified elsewhere: Secondary | ICD-10-CM | POA: Insufficient documentation

## 2023-06-15 DIAGNOSIS — N3941 Urge incontinence: Secondary | ICD-10-CM | POA: Insufficient documentation

## 2023-06-15 DIAGNOSIS — N368 Other specified disorders of urethra: Secondary | ICD-10-CM | POA: Insufficient documentation

## 2023-06-15 LAB — POCT URINALYSIS DIPSTICK
Bilirubin, UA: NEGATIVE
Blood, UA: NEGATIVE
Glucose, UA: NEGATIVE
Ketones, UA: NEGATIVE
Nitrite, UA: POSITIVE
Protein, UA: NEGATIVE
Spec Grav, UA: 1.025 (ref 1.010–1.025)
Urobilinogen, UA: 0.2 U/dL
pH, UA: 6 (ref 5.0–8.0)

## 2023-06-15 MED ORDER — CLINDAMYCIN PHOSPHATE 2 % VA CREA
1.0000 | TOPICAL_CREAM | Freq: Every day | VAGINAL | 0 refills | Status: AC
Start: 2023-06-15 — End: 2023-06-22

## 2023-06-15 MED ORDER — GEMTESA 75 MG PO TABS: 75.0000 mg | ORAL_TABLET | Freq: Every day | ORAL | 2 refills | Status: DC

## 2023-06-15 MED ORDER — GEMTESA 75 MG PO TABS: 75.0000 mg | ORAL_TABLET | Freq: Every day | ORAL | Status: DC

## 2023-06-15 NOTE — Assessment & Plan Note (Signed)
-   smooth walled, fluid filled 1cm cyst at posterior urethral meatus. Nontender and no sign or infection or obstruction - tobacco use - recommended MRI  - discussed need for foley catheter placement if surgical intervention

## 2023-06-15 NOTE — Assessment & Plan Note (Signed)
-   09/23/22 left sided abdominal pain. Rx ceftriaxone. Urine culture >100K E. Coli resistant to ampicillin and intermediate to augmentin. - CT abd/pelvis with contrast 09/23/22: Mild diffuse left renal and ureteral mucosal thickening and enhancement. This can be seen with urinary tract infection - For treatment of recurrent urinary tract infections, we discussed management of recurrent UTIs including prophylaxis with a daily low dose antibiotic, D-mannose, and cranberry supplements.  We discussed the role of diagnostic testing such as cystoscopy and upper tract imaging.   - discussed importance of avoiding antibiotic use in the absence of infection symptoms due to risk of MDRO

## 2023-06-15 NOTE — Telephone Encounter (Signed)
MR Pelvis w w/o contrast has been approved # [id number] until Jan25.2025. Patient has been notified and aware.

## 2023-06-15 NOTE — Patient Instructions (Addendum)
We discussed the symptoms of overactive bladder (OAB), which include urinary urgency, urinary frequency, night-time urination, with or without urge incontinence.  We discussed management including behavioral therapy (decreasing bladder irritants by following a bladder diet, urge suppression strategies, timed voids, bladder retraining), physical therapy, medication; and for refractory cases posterior tibial nerve stimulation, sacral neuromodulation, and intravesical botulinum toxin injection.   For Beta-3 agonist medication, we discussed the potential side effect of elevated blood pressure which is more likely to occur in individuals with uncontrolled hypertension. You were given samples of Gemtesa 75 mg.  It can take a month to start working so give it time, but if you have bothersome side effects call sooner and we can try a different medication.  Call us if you have trouble filling the prescription or if it's not covered by your insurance.  We discussed smoking cessation due to recurrent bacterial vaginosis   Reduce fluid and caffeine intake.   Please schedule your imaging study at your earliest convenience.   Call office if you experience any change in symptoms.  Continue probiotics.   Stop flagyl gel and switched to Clindmycin 2% gel 1 applicator nightly for 7 days.

## 2023-06-15 NOTE — Assessment & Plan Note (Signed)
-   encouraged tobacco cessation for overall health and history of recurrent BV - prior use of Chantix and currently on wellbutrin  - encouraged to follow-up with PCP regarding nicotine gum or other medications for assistance

## 2023-06-15 NOTE — Assessment & Plan Note (Signed)
-   POCT + leuk/nit, catheterized urine testing pending - We discussed the symptoms of overactive bladder (OAB), which include urinary urgency, urinary frequency, nocturia, with or without urge incontinence.  While we do not know the exact etiology of OAB, several treatment options exist. We discussed management including behavioral therapy (decreasing bladder irritants, urge suppression strategies, timed voids, bladder retraining), physical therapy, medication; for refractory cases posterior tibial nerve stimulation, sacral neuromodulation, and intravesical botulinum toxin injection.  For anticholinergic medications, we discussed the potential side effects of anticholinergics including dry eyes, dry mouth, constipation, cognitive impairment and urinary retention. For Beta-3 agonist medication, we discussed the potential side effect of elevated blood pressure which is more likely to occur in individuals with uncontrolled hypertension. - encouraged fluid management and caffeine reduction - samples of gemtesa with Rx to reduce or discontinue pad use

## 2023-06-15 NOTE — Telephone Encounter (Signed)
-----   Message from Loleta Chance sent at 06/15/2023 10:49 AM EST ----- Regarding: MR PA Please submit PA for MR pelvis, urethral cyst, history of tobacco use.
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
## 2023-06-15 NOTE — Assessment & Plan Note (Addendum)
-   history of recurrent bacterial vaginosis and yeast infection - past treatment: suppresion flagyl gel, boric acid, vaginal peroxide, vaginal garlic, vinegar baths - discussed importance of tobacco cessation - discontinue metrogel and switch to PV clindamycin 5g nightly for 7 days - pending Nuswab to r/o resistant pathogens - consider treatment of DIV - consider resuming birth control since onset of symptoms in 2022, previously amenorrheic prior to 2022 on Depo-provera. - discussed proper vulvar care, warm compression, avoid pad use, cotton only underwear

## 2023-06-16 LAB — CERVICOVAGINAL ANCILLARY ONLY
Bacterial Vaginitis (gardnerella): POSITIVE — AB
Candida Glabrata: NEGATIVE
Candida Vaginitis: NEGATIVE
Comment: NEGATIVE
Comment: NEGATIVE
Comment: NEGATIVE

## 2023-06-18 LAB — URINE CULTURE: Culture: >=100000 — AB

## 2023-06-21 ENCOUNTER — Other Ambulatory Visit: Payer: Self-pay | Admitting: Obstetrics

## 2023-06-21 DIAGNOSIS — Z87440 Personal history of urinary (tract) infections: Secondary | ICD-10-CM

## 2023-06-21 DIAGNOSIS — N3941 Urge incontinence: Secondary | ICD-10-CM

## 2023-06-21 MED ORDER — NITROFURANTOIN MONOHYD MACRO 100 MG PO CAPS
100.0000 mg | ORAL_CAPSULE | Freq: Two times a day (BID) | ORAL | 0 refills | Status: AC
Start: 2023-06-21 — End: 2023-06-26

## 2023-06-21 NOTE — Progress Notes (Signed)
Rx macrobid to assess change in urinary symptoms.

## 2023-06-23 ENCOUNTER — Ambulatory Visit
Admission: RE | Admit: 2023-06-23 | Discharge: 2023-06-23 | Disposition: A | Discharge status: Home / Self Care | Payer: Medicaid Other | Source: Ambulatory Visit | Attending: Obstetrics | Admitting: Obstetrics

## 2023-06-23 ENCOUNTER — Telehealth: Payer: Self-pay

## 2023-06-23 DIAGNOSIS — N368 Other specified disorders of urethra: Secondary | ICD-10-CM | POA: Diagnosis present

## 2023-06-23 DIAGNOSIS — F17200 Nicotine dependence, unspecified, uncomplicated: Secondary | ICD-10-CM | POA: Diagnosis present

## 2023-06-23 DIAGNOSIS — N898 Other specified noninflammatory disorders of vagina: Secondary | ICD-10-CM

## 2023-06-23 MED ORDER — GADOBUTROL 1 MMOL/ML IV SOLN
6.0000 mL | Freq: Once | INTRAVENOUS | Status: AC | PRN
  Administered 2023-06-23: 6 mL via INTRAVENOUS

## 2023-06-23 NOTE — Telephone Encounter (Signed)
PA has been submitted with cover my meds for Clindamycin Phosphate   Key: [id number])

## 2023-06-23 NOTE — Telephone Encounter (Signed)
Rx has been denied see fax information for alternative medications.

## 2023-06-25 MED ORDER — CLINDAMYCIN PHOSPHATE 100 MG VA SUPP
100.0000 mg | Freq: Every day | VAGINAL | 0 refills | Status: DC
Start: 2023-06-25 — End: 2023-09-02

## 2023-06-25 NOTE — Telephone Encounter (Signed)
Rx changed to cleocin vaginal suppository 1 nightly for 6 weeks due to insurance denial.  If insurance does not cover, can send to [facility] or [facility] for around $30 on goodrx

## 2023-07-02 NOTE — Progress Notes (Signed)
Patient's [facility] has been denied for Gemtesa 75 mg due to patient not trying and failing any formulary medication. Preferred medication: fesoterodine extended-release tablet, oxybutynin solution/syrup/tablet/extended-release tablet, solifenacin tablet, tolterodine tablet/extended-release capsule, Toviaz tablet.

## 2023-07-07 NOTE — Progress Notes (Signed)
Dr Olena Leatherwood, please advise on request below. Physical copy on your desk. Thank you, Peggy Landry,CMA.  Patient's [facility] has been denied for Gemtesa 75 mg due to patient not trying and failing any formulary medication. Preferred medication: fesoterodine extended-release tablet, oxybutynin solution/syrup/tablet/extended-release tablet, solifenacin tablet, tolterodine tablet/extended-release capsule, Toviaz tablet.

## 2023-07-09 ENCOUNTER — Telehealth: Payer: Self-pay

## 2023-07-09 DIAGNOSIS — N3941 Urge incontinence: Secondary | ICD-10-CM

## 2023-07-09 MED ORDER — MIRABEGRON ER 50 MG PO TB24
50.0000 mg | ORAL_TABLET | Freq: Every day | ORAL | 2 refills | Status: DC
Start: 2023-07-09 — End: 2023-07-16

## 2023-07-09 NOTE — Telephone Encounter (Signed)
Gemtesa not covered by insurance.  Sent mirabegron to assess cost.  Patient to check blood pressure for 1 week while on medication, stop medication if it is elevated > 140/100s

## 2023-07-09 NOTE — Telephone Encounter (Signed)
[employer] PA for Leslye Peer has been denied. No alternative medications recommended. Please advise.

## 2023-07-12 NOTE — Telephone Encounter (Signed)
Patient notified with change of medication

## 2023-07-16 ENCOUNTER — Encounter: Payer: Self-pay | Admitting: Obstetrics

## 2023-07-16 ENCOUNTER — Telehealth (INDEPENDENT_AMBULATORY_CARE_PROVIDER_SITE_OTHER): Payer: Medicaid Other | Admitting: Obstetrics

## 2023-07-16 DIAGNOSIS — N760 Acute vaginitis: Secondary | ICD-10-CM

## 2023-07-16 DIAGNOSIS — N368 Other specified disorders of urethra: Secondary | ICD-10-CM | POA: Diagnosis not present

## 2023-07-16 DIAGNOSIS — N3941 Urge incontinence: Secondary | ICD-10-CM

## 2023-07-16 DIAGNOSIS — Z87440 Personal history of urinary (tract) infections: Secondary | ICD-10-CM

## 2023-07-16 DIAGNOSIS — Z720 Tobacco use: Secondary | ICD-10-CM

## 2023-07-16 DIAGNOSIS — B9689 Other specified bacterial agents as the cause of diseases classified elsewhere: Secondary | ICD-10-CM | POA: Diagnosis not present

## 2023-07-16 NOTE — Progress Notes (Signed)
[facility] Return Visit  Patient location: home Provider location: [facility]  Patient consents to video call and understands the limitations of telehealth visit.  SUBJECTIVE  History of Present Illness: Peggy Landry is a 37 y.o. female seen in follow-up for skene's gland cyst, urgency urinary incontinence, tobacco use, recurrent UTI and BV. Plan at last visit was PV clindamycin, fluid management and caffeine reduction.   Did not try Gemtesa or mirabegron due to improvement in urinary symptoms. Now voids 4x/day (baseline 8x/day), 1-2x/night urinary symptoms improved  with fluid reduction to 28oz (baseline 96oz), 1 soda. Avoids coffee. Leakage with urgency reduced 1-2x/day PV Clindamycin 3x/week with reduction of vaginal discharge or odor, delays in starting due to insurance issues. Nuswab + BV on 06/15/23. UA + leuk/nit, culture >100K E.coli resistant to ampicillin and augmentin. Cutback to 4 cigarettes/day for tobacco cessation, pending to restart Wellbutrin 06/21/23 UTI with tingling and urinary frequency treated with macrobid, resolution of urinary symptoms  MRI 06/23/23 "IMPRESSION: 1.2 cm cyst at the external urethral meatus, consistent with Skene's gland cyst.  Electronically Signed   By: Danae Orleans M.D.   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 No longer with same partner. Prior valtrex Rx in 2022 Increased clear, malodorous vaginal discharge and managed with 1 liner/day  Changed soap and feminine washes, uses Lemisol  Uses cotton underwear and sleeps without underwear. Tried 6 months of flagyl, boric acid, vaginal peroxide, vaginal garlic with decreased yeast infections, vinegar baths Tried apple cider vinegar supplements, Cranberry PRN UTI, probiotics Prior use of Robinol for hyperhidrosis by Dr. Neale Burly  (dermatology) with relief of vaginal odor and discharge in 2021  Femiclear BV with relief  Reports right sided flank pain for 2 weeks.  Denies fever, chills, N/V, hematuria.  Using probiotics, metrogel Current tobacco use, tried Chantix in the past. Currently on wellbutrin.  Prior to 2022, amenorrheic on Depo provera   History of recurrent BV and UTI, managed with metrogel suppression   Treated with diflucan 04/21/23 after Nuswab + BV and Candida 04/19/23 Multiple ED evaluations: - 03/09/23 and treated for UTI and BV with Keflex and Flagyl. Cr 0.9, UA + leuk/heme - 11/22/22 suprapubic abdominal pain, dysuria, and low back pain. Rx Cefdinir. UA + leuk/heme - 11/13/22 urinary frequency, urgency and a urinary odor. Wet prep positive, negative UA - 10/02/22 for repeat UA. Negative UA - 09/23/22 left sided abdominal pain. Rx ceftriaxone. Urine culture >100K E. Coli resistant to ampicillin and intermediate to augmentin - 09/08/22 urinary frequency, urgency, lower abdominal pain occurring intermittently and intermittent right flank pain. Rx diflucan and flagyl for BV and yeast. UA + leuk/ketones - 07/27/22 negative trichomoniasis and HIV, RPR. Wet prep + clue cells and yeast   Follows-up with psych at PCP, uses wellbutrin. Previously seen by pulm and unable to proceed with CTA chest due to insurance   Past Medical History: Patient  has a past medical history of Anxiety, Depression, and Herpes genitalis in women.   Past Surgical History: She  has a past surgical history that includes Wisdom tooth extraction.   Medications: She has a current medication list which includes the following prescription(s): albuterol, bupropion, clindamycin, hydroxyzine, mirabegron er, and mirtazapine.   Allergies: Patient has no known allergies.   Social History: Patient  reports that she has been smoking cigarettes. She has never used smokeless tobacco.  She reports current alcohol use of about 4.0 standard drinks of  alcohol per week. She reports current drug use. Drugs: Marijuana and Cocaine.     OBJECTIVE     Physical Exam: Gen: No apparent distress, A&O x 3.      ASSESSMENT AND PLAN    Peggy Landry is a 37 y.o. with:  1. Tobacco use   2. Skene's gland cyst   3. Bacterial vaginosis   4. Urge urinary incontinence   5. History of recurrent UTI (urinary tract infection)     Tobacco use Assessment & Plan: - cutdown to 4 cigarettes/day from 1ppd - encouraged continue tobacco cessation for overall health and history of recurrent BV - prior use of Chantix and used wellbutrin  - encouraged to follow-up with PCP regarding nicotine gum or other medications for assistance - discussed increased risk of perioperative complications with tobacco use   Skene's gland cyst Assessment & Plan: - smooth walled, fluid filled 1cm cyst at posterior urethral meatus. Nontender and no sign or infection or obstruction - tobacco use - MRI consistent with 1.2cm Skene's gland cyst.  We reviewed the patient's specific anatomic and functional findings, with the assistance of diagrams and handouts.  We reviewed the treatment options including expectant management, conservative management, medical management, and surgical management.  We reviewed the benefits and risks of each treatment option. We discussed risks of bleeding, infection, damage to surrounding organs including bowel, bladder, blood vessels, ureters and nerves, need for further surgery, numbness and weakness at any body site, buttock pain, postoperative cognitive dysfunction, and the rarer risks of blood clot, heart attack, pneumonia, death. All her questions were answered and she verbalized understanding.   - Patient desires to proceed with surgical excision, explained need for lack of vaginal and urinary tract infection at the time of surgical intervention - discussed possible need for foley catheter placement if surgical intervention   Bacterial  vaginosis Assessment & Plan: - history of recurrent bacterial vaginosis and yeast infection - past treatment: suppresion flagyl gel, boric acid, vaginal peroxide, vaginal garlic, vinegar baths - discussed importance to continue tobacco cessation - discontinue metrogel and switch to PV clindamycin 5g nightly for 7 days, increase from 3x/wk - 06/15/23 Nuswab + BV  - consider treatment of DIV  - consider resuming birth control since onset of symptoms in 2022, previously amenorrheic prior to 2022 on Depo-provera. - encouraged to continue proper vulvar care, warm compression, avoid pad use, cotton only underwear - encouraged probiotics    Urge urinary incontinence Assessment & Plan: - POCT + leuk/nit, catheterized urine culture >100K E.coli resistant to ampicillin and augmentin. Rx macrobid with resolution of urinary symptoms. - We discussed the symptoms of overactive bladder (OAB), which include urinary urgency, urinary frequency, nocturia, with or without urge incontinence.  While we do not know the exact etiology of OAB, several treatment options exist. We discussed management including behavioral therapy (decreasing bladder irritants, urge suppression strategies, timed voids, bladder retraining), physical therapy, medication; for refractory cases posterior tibial nerve stimulation, sacral neuromodulation, and intravesical botulinum toxin injection.  For anticholinergic medications, we discussed the potential side effects of anticholinergics including dry eyes, dry mouth, constipation, cognitive impairment and urinary retention. For Beta-3 agonist medication, we discussed the potential side effect of elevated blood pressure which is more likely to occur in individuals with uncontrolled hypertension. - encouraged to continue fluid management and caffeine reduction due to symptomatic relief - samples of gemtesa with Rx to reduce or discontinue pad use  encouraged - For refractory OAB we reviewed the  procedure for intravesical Botox injection with cystoscopy at the time of her skene's gland excision and reviewed the risks, benefits and alternatives of treatment including but not limited to infection, need for self-catheterization and need for repeat therapy.   - We discussed that there is a 5-15% chance of needing to catheterize with Botox and that this usually resolves in a few months; however can persist for longer periods of time.   - Typically Botox injections would need to be repeated every 3-12 months since this is not a permanent therapy. She will return for the procedure.  - patient to consider prior to pre-op visit   History of recurrent UTI (urinary tract infection) Assessment & Plan: - 09/23/22 left sided abdominal pain. Rx ceftriaxone. Urine culture >100K E. Coli resistant to ampicillin and intermediate to augmentin. - CT abd/pelvis with contrast 09/23/22: Mild diffuse left renal and ureteral mucosal thickening and enhancement. This can be seen with urinary tract infection - For treatment of recurrent urinary tract infections, we discussed management of recurrent UTIs including prophylaxis with a daily low dose antibiotic, D-mannose, and cranberry supplements.  We discussed the role of diagnostic testing such as cystoscopy and upper tract imaging.   - discussed importance of avoiding antibiotic use in the absence of infection symptoms due to risk of MDRO - last treated with macrobid 06/21/23 for presumed UTI - encouraged probiotics and office evaluation for catheterized samples for urine culture when she experiences lower urinary tract symptoms   Time spent: I spent 25 minutes dedicated to the care of this patient on the date of this encounter to include pre-visit review of records, face-to-face time with the patient discussing skene's gland cyst, history of UTI, BV, tobacco use, and post visit documentation.    Loleta Chance, MD

## 2023-07-16 NOTE — Assessment & Plan Note (Addendum)
-   cutdown to 4 cigarettes/day from 1ppd - encouraged continue tobacco cessation for overall health and history of recurrent BV - prior use of Chantix and used wellbutrin  - encouraged to follow-up with PCP regarding nicotine gum or other medications for assistance - discussed increased risk of perioperative complications with tobacco use

## 2023-07-16 NOTE — Assessment & Plan Note (Addendum)
-   smooth walled, fluid filled 1cm cyst at posterior urethral meatus. Nontender and no sign or infection or obstruction - tobacco use - MRI consistent with 1.2cm Skene's gland cyst.  We reviewed the patient's specific anatomic and functional findings, with the assistance of diagrams and handouts.  We reviewed the treatment options including expectant management, conservative management, medical management, and surgical management.  We reviewed the benefits and risks of each treatment option. We discussed risks of bleeding, infection, damage to surrounding organs including bowel, bladder, blood vessels, ureters and nerves, need for further surgery, numbness and weakness at any body site, buttock pain, postoperative cognitive dysfunction, and the rarer risks of blood clot, heart attack, pneumonia, death. All her questions were answered and she verbalized understanding.   - Patient desires to proceed with surgical excision, explained need for lack of vaginal and urinary tract infection at the time of surgical intervention - discussed possible need for foley catheter placement if surgical intervention

## 2023-07-16 NOTE — Assessment & Plan Note (Addendum)
-   POCT + leuk/nit, catheterized urine culture >100K E.coli resistant to ampicillin and augmentin. Rx macrobid with resolution of urinary symptoms. - We discussed the symptoms of overactive bladder (OAB), which include urinary urgency, urinary frequency, nocturia, with or without urge incontinence.  While we do not know the exact etiology of OAB, several treatment options exist. We discussed management including behavioral therapy (decreasing bladder irritants, urge suppression strategies, timed voids, bladder retraining), physical therapy, medication; for refractory cases posterior tibial nerve stimulation, sacral neuromodulation, and intravesical botulinum toxin injection.  For anticholinergic medications, we discussed the potential side effects of anticholinergics including dry eyes, dry mouth, constipation, cognitive impairment and urinary retention. For Beta-3 agonist medication, we discussed the potential side effect of elevated blood pressure which is more likely to occur in individuals with uncontrolled hypertension. - encouraged to continue fluid management and caffeine reduction due to symptomatic relief - samples of gemtesa with Rx to reduce or discontinue pad use encouraged - For refractory OAB we reviewed the procedure for intravesical Botox injection with cystoscopy at the time of her skene's gland excision and reviewed the risks, benefits and alternatives of treatment including but not limited to infection, need for self-catheterization and need for repeat therapy.   - We discussed that there is a 5-15% chance of needing to catheterize with Botox and that this usually resolves in a few months; however can persist for longer periods of time.   - Typically Botox injections would need to be repeated every 3-12 months since this is not a permanent therapy. She will return for the procedure.  - patient to consider prior to pre-op visit

## 2023-07-16 NOTE — Assessment & Plan Note (Signed)
-   history of recurrent bacterial vaginosis and yeast infection - past treatment: suppresion flagyl gel, boric acid, vaginal peroxide, vaginal garlic, vinegar baths - discussed importance to continue tobacco cessation - discontinue metrogel and switch to PV clindamycin 5g nightly for 7 days, increase from 3x/wk - 06/15/23 Nuswab + BV  - consider treatment of DIV  - consider resuming birth control since onset of symptoms in 2022, previously amenorrheic prior to 2022 on Depo-provera. - encouraged to continue proper vulvar care, warm compression, avoid pad use, cotton only underwear - encouraged probiotics

## 2023-07-16 NOTE — Assessment & Plan Note (Signed)
-   09/23/22 left sided abdominal pain. Rx ceftriaxone. Urine culture >100K E. Coli resistant to ampicillin and intermediate to augmentin. - CT abd/pelvis with contrast 09/23/22: Mild diffuse left renal and ureteral mucosal thickening and enhancement. This can be seen with urinary tract infection - For treatment of recurrent urinary tract infections, we discussed management of recurrent UTIs including prophylaxis with a daily low dose antibiotic, D-mannose, and cranberry supplements.  We discussed the role of diagnostic testing such as cystoscopy and upper tract imaging.   - discussed importance of avoiding antibiotic use in the absence of infection symptoms due to risk of MDRO - last treated with macrobid 06/21/23 for presumed UTI - encouraged probiotics and office evaluation for catheterized samples for urine culture when she experiences lower urinary tract symptoms

## 2023-07-19 NOTE — Telephone Encounter (Addendum)
Mirabegron is also not covered with medicaid. PA was denied. Please advise on next step in her plan. Thank you

## 2023-07-19 NOTE — Telephone Encounter (Signed)
Will hold for now due to improvement of urinary symptoms without medications.

## 2023-08-12 NOTE — Progress Notes (Deleted)
 [facility] Return Visit  SUBJECTIVE  History of Present Illness: Peggy Landry is a 38 y.o. female seen in follow-up for skene's gland cyst, urgency urinary incontinence, tobacco use, recurrent UTI and BV . Plan at last visit was start [facility].   Did not try Gemtesa or mirabegron due to improvement in urinary symptoms. Now voids 4x/day (baseline 8x/day), 1-2x/night urinary symptoms improved with fluid reduction to 28oz (baseline 96oz), 1 soda. Avoids coffee. Leakage with urgency reduced 1-2x/day*** Botox Probiotics*** PV Clindamycin 3x/wk with reduction of vaginal discharge or odor, delays in starting due to insurance issues. Nuswab + BV on 06/15/23. UA + leuk/nit, culture >100K E.coli resistant to ampicillin and augmentin. Cutback to ***4 cigarettes/day for tobacco cessation, pending to restart Wellbutrin 06/21/23 UTI with tingling and urinary frequency treated with macrobid, resolution of urinary symptoms   MRI 06/23/23 "IMPRESSION: 1.2 cm cyst at the external urethral meatus, consistent with Skene's gland cyst.   Electronically Signed   By: Danae Orleans M.D.   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 No longer with same partner. Prior valtrex Rx in 2022 Increased clear, malodorous vaginal discharge and managed with 1 liner/day  Changed soap and feminine washes, uses Lemisol  Uses cotton underwear and sleeps without underwear. Tried 6 months of flagyl, boric acid, vaginal peroxide, vaginal garlic with decreased yeast infections, vinegar baths Tried apple cider vinegar supplements, Cranberry PRN UTI, probiotics Prior use of Robinol for hyperhidrosis by Dr. Neale Burly (dermatology) with relief of vaginal odor and discharge in 2021  Femiclear BV with relief  Reports right sided flank pain for 2 weeks.  Denies fever, chills, N/V, hematuria.  Using probiotics,  metrogel Current tobacco use, tried Chantix in the past. Currently on wellbutrin.  Prior to 2022, amenorrheic on Depo provera   History of recurrent BV and UTI, managed with metrogel suppression   Treated with diflucan 04/21/23 after Nuswab + BV and Candida 04/19/23 Multiple ED evaluations: - 03/09/23 and treated for UTI and BV with Keflex and Flagyl. Cr 0.9, UA + leuk/heme - 11/22/22 suprapubic abdominal pain, dysuria, and low back pain. Rx Cefdinir. UA + leuk/heme - 11/13/22 urinary frequency, urgency and a urinary odor. Wet prep positive, negative UA - 10/02/22 for repeat UA. Negative UA - 09/23/22 left sided abdominal pain. Rx ceftriaxone. Urine culture >100K E. Coli resistant to ampicillin and intermediate to augmentin - 09/08/22 urinary frequency, urgency, lower abdominal pain occurring intermittently and intermittent right flank pain. Rx diflucan and flagyl for BV and yeast. UA + leuk/ketones - 07/27/22 negative trichomoniasis and HIV, RPR. Wet prep + clue cells and yeast   Follows-up with psych at PCP, uses wellbutrin. Previously seen by pulm and unable to proceed with CTA chest due to insurance  Past Medical History: Patient  has a past medical history of Anxiety, Depression, and Herpes genitalis in women.   Past Surgical History: She  has a past surgical history that includes Wisdom tooth extraction.   Medications: She has a current medication list which includes the following prescription(s): albuterol, bupropion, clindamycin, hydroxyzine, and mirtazapine.   Allergies: Patient has no known allergies.   Social History: Patient  reports that she has been smoking cigarettes. She has never used smokeless tobacco. She reports current alcohol use of about 4.0 standard drinks of alcohol per week. She reports current drug use. Drugs: Marijuana and Cocaine.     OBJECTIVE  Physical Exam: There were no vitals filed for this visit. Gen: No apparent distress, A&O x 3.  Detailed  Urogynecologic Evaluation:  Deferred. Prior exam showed:      No data to display             ASSESSMENT AND PLAN    Peggy Landry is a 38 y.o. with:  No diagnosis found.  There are no diagnoses linked to this encounter.   Loleta Chance, MD

## 2023-08-13 ENCOUNTER — Ambulatory Visit: Payer: Medicaid Other | Admitting: Obstetrics

## 2023-08-13 DIAGNOSIS — Z720 Tobacco use: Secondary | ICD-10-CM

## 2023-08-13 DIAGNOSIS — N368 Other specified disorders of urethra: Secondary | ICD-10-CM

## 2023-08-13 DIAGNOSIS — Z87440 Personal history of urinary (tract) infections: Secondary | ICD-10-CM

## 2023-08-27 ENCOUNTER — Encounter (HOSPITAL_BASED_OUTPATIENT_CLINIC_OR_DEPARTMENT_OTHER): Payer: Self-pay | Admitting: *Deleted

## 2023-09-01 ENCOUNTER — Ambulatory Visit: Payer: Medicaid Other | Admitting: Obstetrics

## 2023-09-01 NOTE — Progress Notes (Unsigned)
Peggy Landry Pre-Operative H&P  Subjective Chief Complaint: Peggy Landry presents for a preoperative encounter.   History of Present Illness: Peggy Landry is a 38 y.o. female who presents for preoperative visit.  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  Her symptoms include urgency urinary incontinence, recurrent UTI, BV.   Reports change in urine odor Continues tobacco use 6 cigarettes/day with recent increase in tobacco use due to stress at work UUI 3x/day when she is at work, reduced with reduction of fluid intake. Did not start Gemtesa Using vagisil powder for moisture and sweating, however reports irritation.   PV Clindamycin 3x/week with reduction of vaginal discharge or odor, delays in starting due to prior insurance issues. Nuswab + BV on 06/15/23. UA + leuk/nit, culture >100K E.coli resistant to ampicillin and augmentin. Used metrogel after completion of clindamycin and reports concerns of yeast infection. Continues liquid probiotics  Past Medical History:  Diagnosis Date   Anxiety    Depression    Herpes genitalis in women      Past Surgical History:  Procedure Laterality Date   WISDOM TOOTH EXTRACTION      has no known allergies.   Family History  Problem Relation Age of Onset   Hypertension Mother    Dementia Father    COPD Father    Heart disease Paternal Grandmother    Breast cancer Neg Hx     Social History   Tobacco Use   Smoking status: Every Day    Current packs/day: 1.00    Types: Cigarettes   Smokeless tobacco: Never   Tobacco comments:    Quitline info given and refer to PCP for meds.  Vaping Use   Vaping status: Never Used  Substance Use Topics   Alcohol use: Yes    Alcohol/week: 4.0 standard drinks of alcohol    Types: 3 Cans of beer, 1 Standard drinks or equivalent per week    Comment: occ   Drug use: Yes    Types: Marijuana, Cocaine    Comment: last use 07/2020 cocaine/     Review of  Systems was negative for a full 10 system review except as noted in the History of Present Illness.   Current Outpatient Medications:    albuterol (VENTOLIN HFA) 108 (90 Base) MCG/ACT inhaler, Inhale 2 puffs into the lungs every 6 (six) hours as needed for wheezing or shortness of breath., Disp: 16 g, Rfl: 1   buPROPion (WELLBUTRIN XL) 300 MG 24 hr tablet, Take 300 mg by mouth daily., Disp: , Rfl:    hydrOXYzine (VISTARIL) 25 MG capsule, Take 25 mg by mouth 3 (three) times daily., Disp: , Rfl:    mirtazapine (REMERON) 15 MG tablet, Take 15 mg by mouth at bedtime. For depression, Disp: , Rfl:    Objective Vitals:   09/02/23 1442  BP: (!) 142/95  Pulse: 87    Gen: NAD Previous Pelvic Exam showed: 1cm periurethral smooth walled cyst at posterior urethral meatus  Lab Results  Component Value Date   COLORU Yellow 09/02/2023   CLARITYU Clear 09/02/2023   GLUCOSEUR Negative 09/02/2023   BILIRUBINUR Negative 09/02/2023   KETONESU Negative 09/02/2023   SPECGRAV 1.015 09/02/2023   RBCUR Negative 09/02/2023   PHUR 6.5 09/02/2023   PROTEINUR Negative 09/02/2023   UROBILINOGEN 0.2 09/02/2023   LEUKOCYTESUR Trace (A) 09/02/2023     Assessment/ Plan  Assessment: The patient is a 38 y.o. year old scheduled to undergo Skene's gland excision  with cystoscopy. Verbal consent was obtained for [facility] gland excision with cystoscopy.  Urge urinary incontinence Assessment & Plan: - 06/15/23 POCT + leuk/nit, catheterized urine culture >100K E.coli resistant to ampicillin and augmentin. Rx macrobid with resolution of urinary symptoms.  - repeat UA testing and Nuswab today due to recurrent UTI and irritative vaginal symptoms, pt completed self swab.  - We discussed the symptoms of overactive bladder (OAB), which include urinary urgency, urinary frequency, nocturia, with or without urge incontinence.  While we do not know the exact etiology of OAB, several treatment options exist. We discussed  management including behavioral therapy (decreasing bladder irritants, urge suppression strategies, timed voids, bladder retraining), physical therapy, medication; for refractory cases posterior tibial nerve stimulation, sacral neuromodulation, and intravesical botulinum toxin injection.  For anticholinergic medications, we discussed the potential side effects of anticholinergics including dry eyes, dry mouth, constipation, cognitive impairment and urinary retention. For Beta-3 agonist medication, we discussed the potential side effect of elevated blood pressure which is more likely to occur in individuals with uncontrolled hypertension. - encouraged to continue fluid management and caffeine reduction due to symptomatic relief - encouraged samples of gemtesa with Rx to reduce or discontinue pad use and vagisil powder use - For refractory OAB we reviewed the procedure for intravesical Botox injection with cystoscopy at the time of her skene's gland excision and reviewed the risks, benefits and alternatives of treatment including but not limited to infection, need for self-catheterization and need for repeat therapy.   - We discussed that there is a 5-15% chance of needing to catheterize with Botox and that this usually resolves in a few months; however can persist for longer periods of time.   - Typically Botox injections would need to be repeated every 3-12 months since this is not a permanent therapy.  - patient to consider options and will possibly need additional treatments for OAB if symptomatic.   Orders: -     Urine Culture; Future -     POCT urinalysis dipstick  Skene's gland cyst Assessment & Plan: - smooth walled, fluid filled 1cm cyst at posterior urethral meatus. Nontender and no sign or infection or obstruction on prior exam. 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  We reviewed the patient's specific anatomic and functional findings, with the assistance of diagrams and handouts.  We reviewed the treatment options including expectant management, conservative management, medical management, and surgical management.  We reviewed the benefits and risks of each treatment option. We discussed risks of bleeding, infection, damage to surrounding organs including bowel, bladder, blood vessels, ureters and nerves, need for further surgery, numbness and weakness at any body site, buttock pain, postoperative cognitive dysfunction, and the rarer risks of blood clot, heart attack, pneumonia, death. All her questions were answered and she verbalized understanding.   - Patient desires to proceed with surgical excision, explained need for lack of vaginal and urinary tract infection at the time of surgical intervention. Scheduled 09/16/23 - discussed possible need for foley catheter placement if surgical intervention results in urethral reconstruction   tobacco use Assessment & Plan: - previously cutdown to 4 cigarettes/day from 1ppd, up to 6 cigarettes/day - encouraged continue tobacco cessation for overall health and history of recurrent BV - prior use of Chantix and used wellbutrin  - encouraged to follow-up with PCP regarding nicotine gum or other medications for assistance -  discussed increased risk of perioperative complications with tobacco use   Bacterial vaginosis -     Cervicovaginal ancillary only     Plan: General Surgical Consent: The patient has previously been counseled on alternative treatments, and the decision by the patient and provider was to proceed with the procedure listed above.  For all procedures, there are risks of bleeding, infection, damage to surrounding organs including but not limited to bowel, bladder, blood vessels, ureters and nerves, and need for further surgery if an injury were to occur. These risks are  all low with minimally invasive surgery.   There are risks of numbness and weakness at any body site or buttock/rectal pain.  It is possible that baseline pain can be worsened by surgery, either with or without mesh. If surgery is vaginal, there is also a low risk of possible conversion to laparoscopy or open abdominal incision where indicated. Very rare risks include blood transfusion, blood clot, heart attack, pneumonia, or death.   There is also a risk of short-term postoperative urinary retention with need to use a catheter. About half of patients need to go home from surgery with a catheter, which is then later removed in the office. The risk of long-term need for a catheter is very low. There is also a risk of worsening of overactive bladder.   We discussed consent for blood products. Risks for blood transfusion include allergic reactions, other reactions that can affect different body organs and managed accordingly, transmission of infectious diseases such as HIV or Hepatitis. However, the blood is screened. Patient consents for blood products.  Pre-operative instructions:  She was instructed to not take Aspirin/NSAIDs x 7days prior to surgery. She may continue her 81mg  ASA. Antibiotic prophylaxis was ordered as indicated.  Catheter use: Patient will go home with foley if needed after post-operative voiding trial.  Post-operative instructions:  She was provided with specific post-operative instructions, including precautions and signs/symptoms for which we would recommend contacting us, in addition to daytime and after-hours contact phone numbers. This was provided on a handout.   Post-operative medications: Prescriptions for motrin, tylenol, miralax, and oxycodone were sent to her pharmacy. Discussed using ibuprofen and tylenol on a schedule to limit use of narcotics.   Laboratory testing:  We will check labs: CMP, CBC. Day of surgery UPT   Preoperative clearance:  She does not require  surgical clearance.    Post-operative follow-up:  A post-operative appointment will be made for 6 weeks from the date of surgery. If she needs a post-operative nurse visit for a voiding trial, that will be set up after she leaves the hospital.    Patient will call the clinic or use MyChart should anything change or any new issues arise.  Time spent: I spent 25 minutes dedicated to the care of this patient on the date of this encounter to include pre-visit review of records, face-to-face time with the patient discussing skene's gland cyst, tobacco use, UUI, and post visit documentation and ordering testing.   Loleta Chance, MD

## 2023-09-02 ENCOUNTER — Ambulatory Visit: Payer: Medicaid Other | Admitting: Obstetrics

## 2023-09-02 ENCOUNTER — Encounter: Payer: Self-pay | Admitting: Obstetrics

## 2023-09-02 ENCOUNTER — Other Ambulatory Visit (HOSPITAL_COMMUNITY)
Admission: RE | Admit: 2023-09-02 | Discharge: 2023-09-02 | Disposition: A | Discharge status: Home / Self Care | Payer: Medicaid Other | Source: Ambulatory Visit | Attending: Obstetrics | Admitting: Obstetrics

## 2023-09-02 ENCOUNTER — Other Ambulatory Visit (HOSPITAL_COMMUNITY)
Admission: RE | Admit: 2023-09-02 | Discharge: 2023-09-02 | Disposition: A | Discharge status: Home / Self Care | Payer: Medicaid Other | Source: Other Acute Inpatient Hospital | Attending: Obstetrics | Admitting: Obstetrics

## 2023-09-02 VITALS — BP 142/95 | HR 87

## 2023-09-02 DIAGNOSIS — Z87440 Personal history of urinary (tract) infections: Secondary | ICD-10-CM

## 2023-09-02 DIAGNOSIS — B9689 Other specified bacterial agents as the cause of diseases classified elsewhere: Secondary | ICD-10-CM

## 2023-09-02 DIAGNOSIS — N368 Other specified disorders of urethra: Secondary | ICD-10-CM

## 2023-09-02 DIAGNOSIS — N898 Other specified noninflammatory disorders of vagina: Secondary | ICD-10-CM

## 2023-09-02 DIAGNOSIS — Z720 Tobacco use: Secondary | ICD-10-CM

## 2023-09-02 DIAGNOSIS — F17200 Nicotine dependence, unspecified, uncomplicated: Secondary | ICD-10-CM | POA: Diagnosis not present

## 2023-09-02 DIAGNOSIS — N3941 Urge incontinence: Secondary | ICD-10-CM | POA: Insufficient documentation

## 2023-09-02 DIAGNOSIS — N760 Acute vaginitis: Secondary | ICD-10-CM | POA: Diagnosis present

## 2023-09-02 LAB — POCT URINALYSIS DIPSTICK
Bilirubin, UA: NEGATIVE
Blood, UA: NEGATIVE
Glucose, UA: NEGATIVE
Ketones, UA: NEGATIVE
Nitrite, UA: POSITIVE
Protein, UA: NEGATIVE
Spec Grav, UA: 1.015 (ref 1.010–1.025)
Urobilinogen, UA: 0.2 U/dL
pH, UA: 6.5 (ref 5.0–8.0)

## 2023-09-02 NOTE — Assessment & Plan Note (Signed)
-   06/15/23 POCT + leuk/nit, catheterized urine culture >100K E.coli resistant to ampicillin and augmentin. Rx macrobid with resolution of urinary symptoms.  - repeat UA testing and Nuswab today due to recurrent UTI and irritative vaginal symptoms, pt completed self swab.  - We discussed the symptoms of overactive bladder (OAB), which include urinary urgency, urinary frequency, nocturia, with or without urge incontinence.  While we do not know the exact etiology of OAB, several treatment options exist. We discussed management including behavioral therapy (decreasing bladder irritants, urge suppression strategies, timed voids, bladder retraining), physical therapy, medication; for refractory cases posterior tibial nerve stimulation, sacral neuromodulation, and intravesical botulinum toxin injection.  For anticholinergic medications, we discussed the potential side effects of anticholinergics including dry eyes, dry mouth, constipation, cognitive impairment and urinary retention. For Beta-3 agonist medication, we discussed the potential side effect of elevated blood pressure which is more likely to occur in individuals with uncontrolled hypertension. - encouraged to continue fluid management and caffeine reduction due to symptomatic relief - encouraged samples of gemtesa with Rx to reduce or discontinue pad use and vagisil powder use - For refractory OAB we reviewed the procedure for intravesical Botox injection with cystoscopy at the time of her skene's gland excision and reviewed the risks, benefits and alternatives of treatment including but not limited to infection, need for self-catheterization and need for repeat therapy.   - We discussed that there is a 5-15% chance of needing to catheterize with Botox and that this usually resolves in a few months; however can persist for longer periods of time.   - Typically Botox injections would need to be repeated every 3-12 months since this is not a permanent  therapy.  - patient to consider options and will possibly need additional treatments for OAB if symptomatic.

## 2023-09-02 NOTE — Assessment & Plan Note (Signed)
-   smooth walled, fluid filled 1cm cyst at posterior urethral meatus. Nontender and no sign or infection or obstruction on prior exam. 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  We reviewed the patient's specific anatomic and functional findings, with the assistance of diagrams and handouts.  We reviewed the treatment options including expectant management, conservative management, medical management, and surgical management.  We reviewed the benefits and risks of each treatment option. We discussed risks of bleeding, infection, damage to surrounding organs including bowel, bladder, blood vessels, ureters and nerves, need for further surgery, numbness and weakness at any body site, buttock pain, postoperative cognitive dysfunction, and the rarer risks of blood clot, heart attack, pneumonia, death. All her questions were answered and she verbalized understanding.   - Patient desires to proceed with surgical excision, explained need for lack of vaginal and urinary tract infection at the time of surgical intervention. Scheduled 09/16/23 - discussed possible need for foley catheter placement if surgical intervention results in urethral reconstruction

## 2023-09-02 NOTE — Patient Instructions (Signed)
Please try the Gemtesa to assess change in urinary leakage  We discussed the symptoms of overactive bladder (OAB), which include urinary urgency, urinary frequency, nocturia, with or without urge incontinence.  While we do not know the exact etiology of OAB, several treatment options exist. We discussed management including behavioral therapy (decreasing bladder irritants, urge suppression strategies, timed voids, bladder retraining), physical therapy, medication; for refractory cases posterior tibial nerve stimulation, sacral neuromodulation, and intravesical botulinum toxin injection.  For anticholinergic medications, we discussed the potential side effects of anticholinergics including dry eyes, dry mouth, constipation, cognitive impairment and urinary retention. For Beta-3 agonist medication, we discussed the potential side effect of elevated blood pressure which is more likely to occur in individuals with uncontrolled hypertension.  Continue tobacco cessation to reduce your risk of perioperative complications.   Please call if you experience change in urinary or vaginal symptoms prior to surgery.

## 2023-09-02 NOTE — Assessment & Plan Note (Signed)
-   previously cutdown to 4 cigarettes/day from 1ppd, up to 6 cigarettes/day - encouraged continue tobacco cessation for overall health and history of recurrent BV - prior use of Chantix and used wellbutrin  - encouraged to follow-up with PCP regarding nicotine gum or other medications for assistance - discussed increased risk of perioperative complications with tobacco use

## 2023-09-02 NOTE — Addendum Note (Signed)
Addended by: Alexis Frock on: 09/02/2023 04:40 PM   Modules accepted: Orders

## 2023-09-03 ENCOUNTER — Encounter: Payer: Self-pay | Admitting: Obstetrics

## 2023-09-03 LAB — CERVICOVAGINAL ANCILLARY ONLY
Bacterial Vaginitis (gardnerella): NEGATIVE
Candida Glabrata: NEGATIVE
Candida Vaginitis: POSITIVE — AB
Comment: NEGATIVE
Comment: NEGATIVE
Comment: NEGATIVE

## 2023-09-03 MED ORDER — FLUCONAZOLE 150 MG PO TABS: 150.0000 mg | ORAL_TABLET | Freq: Once | ORAL | 1 refills | Status: AC

## 2023-09-03 MED ORDER — NITROFURANTOIN MONOHYD MACRO 100 MG PO CAPS: 100.0000 mg | ORAL_CAPSULE | Freq: Two times a day (BID) | ORAL | 0 refills | Status: DC

## 2023-09-03 NOTE — Addendum Note (Signed)
Addended byWyatt Haste T on: 09/03/2023 04:40 PM   Modules accepted: Orders

## 2023-09-03 NOTE — Addendum Note (Signed)
Addended byWyatt Haste T on: 09/03/2023 01:24 PM   Modules accepted: Orders

## 2023-09-04 LAB — URINE CULTURE: Culture: >=100000 — AB

## 2023-09-14 ENCOUNTER — Encounter (HOSPITAL_COMMUNITY): Payer: Self-pay | Admitting: Obstetrics

## 2023-09-14 NOTE — Progress Notes (Signed)
 Addendum: pt stated she had a runny nose and headache within the last week, advised pt to call surgeons office and let them know in case surgery needed to be rescheduled.     Spoke w/ via phone for pre-op interview---[facility] needs dos----  UPT per surgeon       Lab results------ COVID test -----patient states asymptomatic no test needed Arrive at -------1030 NPO after MN NO Solid Food.  Clear liquids from MN until---0930 Pre-Surgery Ensure or G2:  Med rec completed Medications to take morning of surgery -----Albuterol inhaler, Wellbutrin,Gemtesa and Hydroxyzine Diabetic medication -----  GLP1 agonist last dose: GLP1 instructions:  Patient instructed no nail polish to be worn day of surgery Patient instructed to bring photo id and insurance card day of surgery Patient aware to have Driver (ride ) / caregiver    for 24 hours after surgery - Mother Curlene Dolphin Patient Special Instructions -----Shower with antibacterial soap. Pre-Op special Instructions -----  Patient verbalized understanding of instructions that were given at this phone interview. Patient denies chest pain, sob, fever, cough at the interview.

## 2023-09-16 ENCOUNTER — Ambulatory Visit (HOSPITAL_COMMUNITY): Payer: Medicaid Other | Admitting: Anesthesiology

## 2023-09-16 ENCOUNTER — Ambulatory Visit (HOSPITAL_COMMUNITY)
Admission: RE | Admit: 2023-09-16 | Discharge: 2023-09-16 | Disposition: A | Discharge status: Home / Self Care | Payer: Medicaid Other | Attending: Obstetrics | Admitting: Obstetrics

## 2023-09-16 ENCOUNTER — Encounter (HOSPITAL_COMMUNITY): Payer: Self-pay | Admitting: Obstetrics

## 2023-09-16 ENCOUNTER — Encounter (HOSPITAL_COMMUNITY)
Admission: RE | Disposition: A | Discharge status: Home / Self Care | Payer: Self-pay | Source: Home / Self Care | Attending: Obstetrics

## 2023-09-16 DIAGNOSIS — Z538 Procedure and treatment not carried out for other reasons: Secondary | ICD-10-CM | POA: Diagnosis not present

## 2023-09-16 DIAGNOSIS — Z87440 Personal history of urinary (tract) infections: Secondary | ICD-10-CM

## 2023-09-16 DIAGNOSIS — N368 Other specified disorders of urethra: Secondary | ICD-10-CM | POA: Diagnosis present

## 2023-09-16 LAB — POCT PREGNANCY, URINE: Preg Test, Ur: NEGATIVE

## 2023-09-16 SURGERY — EXCISION URETHRAL CYST
Anesthesia: General

## 2023-09-16 MED ORDER — CEFAZOLIN SODIUM-DEXTROSE 2-4 GM/100ML-% IV SOLN
2.0000 g | INTRAVENOUS | Status: DC
Start: 2023-09-16 — End: 2023-09-16

## 2023-09-16 MED ORDER — PROPOFOL 10 MG/ML IV BOLUS
INTRAVENOUS | Status: AC
  Filled 2023-09-16: qty 20

## 2023-09-16 MED ORDER — ONDANSETRON HCL 4 MG/2ML IJ SOLN
INTRAMUSCULAR | Status: AC
Start: 2023-09-16 — End: ?
  Filled 2023-09-16: qty 2

## 2023-09-16 MED ORDER — LACTATED RINGERS IV SOLN: INTRAVENOUS | Status: DC

## 2023-09-16 MED ORDER — ACETAMINOPHEN 500 MG PO TABS: 1000.0000 mg | ORAL_TABLET | ORAL | Status: DC

## 2023-09-16 MED ORDER — CHLORHEXIDINE GLUCONATE 0.12 % MT SOLN: 15.0000 mL | Freq: Once | OROMUCOSAL | Status: DC

## 2023-09-16 MED ORDER — CEFAZOLIN SODIUM-DEXTROSE 2-4 GM/100ML-% IV SOLN
INTRAVENOUS | Status: AC
  Filled 2023-09-16: qty 100

## 2023-09-16 MED ORDER — DEXMEDETOMIDINE HCL IN NACL 80 MCG/20ML IV SOLN
INTRAVENOUS | Status: AC
  Filled 2023-09-16: qty 20

## 2023-09-16 MED ORDER — ROCURONIUM BROMIDE 10 MG/ML (PF) SYRINGE
PREFILLED_SYRINGE | INTRAVENOUS | Status: AC
  Filled 2023-09-16: qty 10

## 2023-09-16 MED ORDER — GABAPENTIN 300 MG PO CAPS
ORAL_CAPSULE | ORAL | Status: AC
  Filled 2023-09-16: qty 1

## 2023-09-16 MED ORDER — ACETAMINOPHEN 500 MG PO TABS
ORAL_TABLET | ORAL | Status: DC
Start: 2023-09-16 — End: 2023-09-16
  Filled 2023-09-16: qty 2

## 2023-09-16 MED ORDER — CHLORHEXIDINE GLUCONATE 0.12 % MT SOLN
OROMUCOSAL | Status: AC
  Filled 2023-09-16: qty 15

## 2023-09-16 MED ORDER — LIDOCAINE 2% (20 MG/ML) 5 ML SYRINGE
INTRAMUSCULAR | Status: AC
Start: 2023-09-16 — End: ?
  Filled 2023-09-16: qty 5

## 2023-09-16 MED ORDER — GABAPENTIN 300 MG PO CAPS: 300.0000 mg | ORAL_CAPSULE | ORAL | Status: DC

## 2023-09-16 MED ORDER — MIDAZOLAM HCL 2 MG/2ML IJ SOLN
INTRAMUSCULAR | Status: AC
  Filled 2023-09-16: qty 2

## 2023-09-16 MED ORDER — ORAL CARE MOUTH RINSE: 15.0000 mL | Freq: Once | OROMUCOSAL | Status: DC

## 2023-09-16 MED ORDER — DEXAMETHASONE SODIUM PHOSPHATE 10 MG/ML IJ SOLN
INTRAMUSCULAR | Status: AC
  Filled 2023-09-16: qty 1

## 2023-09-16 MED ORDER — CELECOXIB 200 MG PO CAPS: 400.0000 mg | ORAL_CAPSULE | ORAL | Status: DC

## 2023-09-16 MED ORDER — FENTANYL CITRATE (PF) 250 MCG/5ML IJ SOLN
INTRAMUSCULAR | Status: AC
  Filled 2023-09-16: qty 5

## 2023-09-16 NOTE — Progress Notes (Addendum)
 Pt complains of cold symptoms (congestion, yellow-colored mucus, drainage, sinus pressure) for 2 days. States no fever and has not taken a home covid/flu test. States son had sinus infection last week and feels symptoms are the same. Notified C Jackson MDA who evaluated at bedside, and counseled pt on risks of proceeding. Pt agrees to reschedule procedure. LVM for Dr. Olena Leatherwood to notify of decision.   Spoke with Chassity RN in OR who spoke with Dr. Olena Leatherwood. Pt will receive call from office and MD today for follow-up steps. Informed pt and she is agreeable.

## 2023-09-16 NOTE — Anesthesia Preprocedure Evaluation (Addendum)
 Anesthesia Evaluation  Patient identified by MRN, date of birth, ID band Patient awake    Reviewed: Allergy & Precautions, NPO status , Patient's Chart, lab work & pertinent test results  History of Anesthesia Complications Negative for: history of anesthetic complications  Airway Mallampati: III  TM Distance: >3 FB Neck ROM: Full    Dental  (+) Dental Advisory Given, Teeth Intact   Pulmonary asthma (albuterol) , Recent URI , Residual Cough, Current Smoker Pt with active URI: nasal drainage, cough   breath sounds clear to auscultation       Cardiovascular negative cardio ROS  Rhythm:Regular Rate:Normal     Neuro/Psych   Anxiety Depression Bipolar Disorder      GI/Hepatic negative GI ROS, Neg liver ROS,,,  Endo/Other  negative endocrine ROS    Renal/GU negative Renal ROS     Musculoskeletal   Abdominal   Peds  Hematology negative hematology ROS (+)   Anesthesia Other Findings   Reproductive/Obstetrics                             Anesthesia Physical Anesthesia Plan  ASA: 2  Anesthesia Plan:    Post-op Pain Management:    Induction:   PONV Risk Score and Plan:   Airway Management Planned:   Additional Equipment:   Intra-op Plan:   Post-operative Plan:   Informed Consent:   Plan Discussed with:   Anesthesia Plan Comments: (Pt with active URI and asthma, will postpone )       Anesthesia Quick Evaluation

## 2023-10-22 ENCOUNTER — Encounter: Payer: Self-pay | Admitting: Obstetrics

## 2023-10-22 ENCOUNTER — Ambulatory Visit: Payer: Medicaid Other | Admitting: Obstetrics

## 2023-10-22 VITALS — BP 111/72 | HR 107

## 2023-10-22 DIAGNOSIS — N3941 Urge incontinence: Secondary | ICD-10-CM

## 2023-10-22 DIAGNOSIS — Z01818 Encounter for other preprocedural examination: Secondary | ICD-10-CM

## 2023-10-22 DIAGNOSIS — Z87440 Personal history of urinary (tract) infections: Secondary | ICD-10-CM

## 2023-10-22 DIAGNOSIS — F17200 Nicotine dependence, unspecified, uncomplicated: Secondary | ICD-10-CM

## 2023-10-22 DIAGNOSIS — N368 Other specified disorders of urethra: Secondary | ICD-10-CM

## 2023-10-22 MED ORDER — SULFAMETHOXAZOLE-TRIMETHOPRIM 800-160 MG PO TABS: 1.0000 | ORAL_TABLET | Freq: Two times a day (BID) | ORAL | 0 refills | Status: AC

## 2023-10-22 NOTE — Assessment & Plan Note (Signed)
-   returned to 4 cigarettes/day  - encouraged continue tobacco cessation for overall health and history of recurrent BV - prior use of Chantix and used wellbutrin  - pending Rx from PCP regarding nicotine gum or other medications for assistance - discussed increased risk of perioperative complications with tobacco use

## 2023-10-22 NOTE — Progress Notes (Signed)
 [facility] Pre-Operative H&P  Subjective Chief Complaint: Peggy Landry presents for a preoperative encounter.   History of Present Illness: Peggy Landry is a 38 y.o. female who presents for preoperative visit.  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  Her symptoms include urgency urinary incontinence, recurrent UTI, BV.   Denies UTI symptoms or change in vaginal symptoms. Denies sensation of incomplete emptying.  Continues tobacco use down to 4 from 6 cigarettes/day, pending to start medication for tobacco cessation. UUI 3x/day when she is at work, reduced with reduction of fluid intake. Gemtesa with relief with reduction to UUI 2x/day, however reports dry mouth. Improve symptoms attributed to increased void times and pelvic floor relaxation. Recommended to discontinue vagisil powder for moisture and sweating due to irritation   PV Clindamycin 3x/wk with reduction of vaginal discharge or odor Nuswab + BV on 06/15/23. UA + leuk/nit, culture >100K E.coli resistant to ampicillin and augmentin. Used metrogel after completion of clindamycin and reports concerns of yeast infection. Continues liquid probiotics  Past Medical History:  Diagnosis Date   Anxiety    Depression    Herpes genitalis in women      Past Surgical History:  Procedure Laterality Date   WISDOM TOOTH EXTRACTION      has no known allergies.   Family History  Problem Relation Age of Onset   Hypertension Mother    Dementia Father    COPD Father    Heart disease Paternal Grandmother    Breast cancer Neg Hx     Social History   Tobacco Use   Smoking status: Every Day    Current packs/day: 1.00    Types: Cigarettes   Smokeless tobacco: Never   Tobacco comments:    Quitline info given and refer to PCP for meds.  Vaping Use   Vaping status: Never Used  Substance Use Topics   Alcohol use: Yes    Alcohol/week: 4.0 standard drinks of  alcohol    Types: 3 Cans of beer, 1 Standard drinks or equivalent per week    Comment: Once a week   Drug use: Not Currently    Types: Marijuana, Cocaine    Comment: last use 07/2020 cocaine/     Review of Systems was negative for a full 10 system review except as noted in the History of Present Illness.   Current Outpatient Medications:    albuterol (VENTOLIN HFA) 108 (90 Base) MCG/ACT inhaler, Inhale 2 puffs into the lungs every 6 (six) hours as needed for wheezing or shortness of breath., Disp: 16 g, Rfl: 1   aspirin-sod bicarb-citric acid (ALKA-SELTZER) 325 MG TBEF tablet, Take 325 mg by mouth every 6 (six) hours as needed., Disp: , Rfl:    buPROPion (WELLBUTRIN XL) 300 MG 24 hr tablet, Take 300 mg by mouth daily., Disp: , Rfl:    hydrOXYzine (VISTARIL) 25 MG capsule, Take 25 mg by mouth daily., Disp: , Rfl:    naproxen (NAPROSYN) 500 MG tablet, NAPROXEN 500 MG TABS, Disp: , Rfl:    OVER THE COUNTER MEDICATION, Take 1 capsule by mouth daily. chlorophyll pills, Disp: , Rfl:    Probiotic Product (PROBIOTIC PO), Take 1 capsule by mouth daily., Disp: , Rfl:    Pseudoeph-CPM-DM-APAP (TYLENOL COLD & FLU DAY/NIGHT PO), Take 2 capsules by mouth as needed., Disp: , Rfl:    sulfamethoxazole-trimethoprim (BACTRIM DS) 800-160 MG tablet, Take 1 tablet by mouth 2 (two) times daily  for 3 days. Start 3 days prior to your scheduled surgery, Disp: 6 tablet, Rfl: 0   Vibegron (GEMTESA) 75 MG TABS, Take 75 mg by mouth daily., Disp: , Rfl:    Objective Vitals:   10/22/23 1521  BP: 111/72  Pulse: (!) 107    Gen: NAD Previous Pelvic Exam showed: 1cm periurethral smooth walled cyst at posterior urethral meatus  Lab Results  Component Value Date   COLORU Yellow 09/02/2023   CLARITYU Clear 09/02/2023   GLUCOSEUR Negative 09/02/2023   BILIRUBINUR Negative 09/02/2023   KETONESU Negative 09/02/2023   SPECGRAV 1.015 09/02/2023   RBCUR Negative 09/02/2023   PHUR 6.5 09/02/2023   PROTEINUR Negative  09/02/2023   UROBILINOGEN 0.2 09/02/2023   LEUKOCYTESUR Trace (A) 09/02/2023     Assessment/ Plan  Assessment: The patient is a 38 y.o. year old scheduled to undergo Skene's gland excision with cystoscopy. Verbal consent was obtained for [facility] gland excision with cystoscopy.  Skene's gland cyst Assessment & Plan: - smooth walled, fluid filled 1cm cyst at posterior urethral meatus on prior exam. Nontender and no sign or infection or obstruction on prior exam. 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  - We reviewed the patient's specific anatomic and functional findings, with the assistance of diagrams and handouts.  We reviewed the treatment options including expectant management, conservative management, medical management, and surgical management.  We reviewed the benefits and risks of each treatment option. We discussed risks of bleeding, infection, damage to surrounding organs including bowel, bladder, blood vessels, ureters and nerves, need for further surgery, numbness and weakness at any body site, buttock pain, postoperative cognitive dysfunction, and the rarer risks of blood clot, heart attack, pneumonia, death. All her questions were answered and she verbalized understanding.   - Patient desires to proceed with surgical excision, explained need for lack of vaginal and urinary tract infection at the time of surgical intervention. Cancelled on 09/16/23 due to URI, pending reschedule - discussed possible need for foley catheter placement if surgical intervention results in urethral reconstruction   tobacco use Assessment & Plan: - returned to 4 cigarettes/day  - encouraged continue tobacco cessation for overall health and history of recurrent BV - prior use of Chantix and used wellbutrin  - pending Rx from PCP regarding nicotine gum  or other medications for assistance - discussed increased risk of perioperative complications with tobacco use   History of recurrent UTI (urinary tract infection) Assessment & Plan: - 06/15/23 POCT + leuk/nit, catheterized urine culture >100K E.coli resistant to ampicillin and augmentin. Rx macrobid with resolution of urinary symptoms.  - repeat UA 09/02/23 POCT + leuk/nit, catheterized urine culture >100K E.coli resistant to ampicillin and augmentin. Rx macrobid with resolution of urinary symptoms.  Nuswab + candida, Rx Diflucan  - 09/23/22 left sided abdominal pain. Rx ceftriaxone. Urine culture >100K E. Coli resistant to ampicillin and intermediate to augmentin. - CT abd/pelvis with contrast 09/23/22: Mild diffuse left renal and ureteral mucosal thickening and enhancement. This can be seen with urinary tract infection - For treatment of recurrent urinary tract infections, we discussed management of recurrent UTIs including prophylaxis with a daily low dose antibiotic, D-mannose, and cranberry supplements.  We discussed the role of diagnostic testing such as cystoscopy and upper tract imaging.   - discussed importance of avoiding antibiotic use in the absence of infection symptoms due to risk  of MDRO - Rx bactrim to start 3 days prior to scheduled procedure due to history of recurrent UTI - encouraged probiotics and office evaluation for catheterized samples for urine culture when she experiences lower urinary tract symptoms  Orders: -     Sulfamethoxazole-Trimethoprim; Take 1 tablet by mouth 2 (two) times daily for 3 days. Start 3 days prior to your scheduled surgery  Dispense: 6 tablet; Refill: 0  Urge urinary incontinence Assessment & Plan: - reduced leakage with relaxation during void and increased void time - Gemtesa with possible relief, however reports dry mouth - We discussed the symptoms of overactive bladder (OAB), which include urinary urgency, urinary frequency, nocturia, with or  without urge incontinence.  While we do not know the exact etiology of OAB, several treatment options exist. We discussed management including behavioral therapy (decreasing bladder irritants, urge suppression strategies, timed voids, bladder retraining), physical therapy, medication; for refractory cases posterior tibial nerve stimulation, sacral neuromodulation, and intravesical botulinum toxin injection.  For anticholinergic medications, we discussed the potential side effects of anticholinergics including dry eyes, dry mouth, constipation, cognitive impairment and urinary retention. For Beta-3 agonist medication, we discussed the potential side effect of elevated blood pressure which is more likely to occur in individuals with uncontrolled hypertension. - encouraged to continue fluid management and caffeine reduction due to symptomatic relief - reduce pad use and discontinue vagisil powder use - For refractory OAB we reviewed the procedure for intravesical Botox injection with cystoscopy at the time of her skene's gland excision and reviewed the risks, benefits and alternatives of treatment including but not limited to infection, need for self-catheterization and need for repeat therapy.   - We discussed that there is a 5-15% chance of needing to catheterize with Botox and that this usually resolves in a few months; however can persist for longer periods of time.   - Typically Botox injections would need to be repeated every 3-12 months since this is not a permanent therapy.  - patient to consider options and will possibly need additional treatments for OAB if symptomatic.    Plan: Skene's Gland cyst excision, cystoscopy  General Surgical Consent: The patient has previously been counseled on alternative treatments, and the decision by the patient and provider was to proceed with the procedure listed above.  For all procedures, there are risks of bleeding, infection, damage to surrounding organs  including but not limited to bowel, bladder, blood vessels, ureters and nerves, and need for further surgery if an injury were to occur. These risks are all low with minimally invasive surgery.   There are risks of numbness and weakness at any body site or buttock/rectal pain.  It is possible that baseline pain can be worsened by surgery, either with or without mesh. If surgery is vaginal, there is also a low risk of possible conversion to laparoscopy or open abdominal incision where indicated. Very rare risks include blood transfusion, blood clot, heart attack, pneumonia, or death.   There is also a risk of short-term postoperative urinary retention with need to use a catheter. About half of patients need to go home from surgery with a catheter, which is then later removed in the office. The risk of long-term need for a catheter is very low. There is also a risk of worsening of overactive bladder.   We discussed consent for blood products. Risks for blood transfusion include allergic reactions, other reactions that can affect different body organs and managed accordingly, transmission of infectious diseases such as HIV  or Hepatitis. However, the blood is screened. Patient consents for blood products.  Pre-operative instructions:  She was instructed to not take Aspirin/NSAIDs x 7days prior to surgery. She may continue her 81mg  ASA. Antibiotic prophylaxis was ordered as indicated.  Catheter use: Patient will go home with foley if needed after post-operative voiding trial.  Post-operative instructions:  She was provided with specific post-operative instructions, including precautions and signs/symptoms for which we would recommend contacting us, in addition to daytime and after-hours contact phone numbers. This was provided on a handout.   Post-operative medications: Prescriptions for motrin, tylenol, miralax, and oxycodone were sent to her pharmacy. Discussed using ibuprofen and tylenol on a schedule to  limit use of narcotics.   Laboratory testing:  We will check labs: CMP, CBC. Day of surgery UPT   Preoperative clearance:  She does not require surgical clearance.    Post-operative follow-up:  A post-operative appointment will be made for 6 weeks from the date of surgery. If she needs a post-operative nurse visit for a voiding trial, that will be set up after she leaves the hospital.    Patient will call the clinic or use MyChart should anything change or any new issues arise.  Loleta Chance, MD

## 2023-10-22 NOTE — Assessment & Plan Note (Addendum)
-   smooth walled, fluid filled 1cm cyst at posterior urethral meatus on prior exam. Nontender and no sign or infection or obstruction on prior exam. 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  - We reviewed the patient's specific anatomic and functional findings, with the assistance of diagrams and handouts.  We reviewed the treatment options including expectant management, conservative management, medical management, and surgical management.  We reviewed the benefits and risks of each treatment option. We discussed risks of bleeding, infection, damage to surrounding organs including bowel, bladder, blood vessels, ureters and nerves, need for further surgery, numbness and weakness at any body site, buttock pain, postoperative cognitive dysfunction, and the rarer risks of blood clot, heart attack, pneumonia, death. All her questions were answered and she verbalized understanding.   - Patient desires to proceed with surgical excision, explained need for lack of vaginal and urinary tract infection at the time of surgical intervention. Cancelled on 09/16/23 due to URI, pending reschedule - discussed possible need for foley catheter placement if surgical intervention results in urethral reconstruction

## 2023-10-22 NOTE — Assessment & Plan Note (Addendum)
-   06/15/23 POCT + leuk/nit, catheterized urine culture >100K E.coli resistant to ampicillin and augmentin. Rx macrobid with resolution of urinary symptoms.  - repeat UA 09/02/23 POCT + leuk/nit, catheterized urine culture >100K E.coli resistant to ampicillin and augmentin. Rx macrobid with resolution of urinary symptoms.  Nuswab + candida, Rx Diflucan  - 09/23/22 left sided abdominal pain. Rx ceftriaxone. Urine culture >100K E. Coli resistant to ampicillin and intermediate to augmentin. - CT abd/pelvis with contrast 09/23/22: Mild diffuse left renal and ureteral mucosal thickening and enhancement. This can be seen with urinary tract infection - For treatment of recurrent urinary tract infections, we discussed management of recurrent UTIs including prophylaxis with a daily low dose antibiotic, D-mannose, and cranberry supplements.  We discussed the role of diagnostic testing such as cystoscopy and upper tract imaging.   - discussed importance of avoiding antibiotic use in the absence of infection symptoms due to risk of MDRO - Rx bactrim to start 3 days prior to scheduled procedure due to history of recurrent UTI - encouraged probiotics and office evaluation for catheterized samples for urine culture when she experiences lower urinary tract symptoms

## 2023-10-22 NOTE — Assessment & Plan Note (Addendum)
-   reduced leakage with relaxation during void and increased void time - Gemtesa with possible relief, however reports dry mouth - We discussed the symptoms of overactive bladder (OAB), which include urinary urgency, urinary frequency, nocturia, with or without urge incontinence.  While we do not know the exact etiology of OAB, several treatment options exist. We discussed management including behavioral therapy (decreasing bladder irritants, urge suppression strategies, timed voids, bladder retraining), physical therapy, medication; for refractory cases posterior tibial nerve stimulation, sacral neuromodulation, and intravesical botulinum toxin injection.  For anticholinergic medications, we discussed the potential side effects of anticholinergics including dry eyes, dry mouth, constipation, cognitive impairment and urinary retention. For Beta-3 agonist medication, we discussed the potential side effect of elevated blood pressure which is more likely to occur in individuals with uncontrolled hypertension. - encouraged to continue fluid management and caffeine reduction due to symptomatic relief - reduce pad use and discontinue vagisil powder use - For refractory OAB we reviewed the procedure for intravesical Botox injection with cystoscopy at the time of her skene's gland excision and reviewed the risks, benefits and alternatives of treatment including but not limited to infection, need for self-catheterization and need for repeat therapy.   - We discussed that there is a 5-15% chance of needing to catheterize with Botox and that this usually resolves in a few months; however can persist for longer periods of time.   - Typically Botox injections would need to be repeated every 3-12 months since this is not a permanent therapy.  - patient to consider options and will possibly need additional treatments for OAB if symptomatic.

## 2023-12-06 ENCOUNTER — Telehealth: Payer: Self-pay

## 2023-12-06 NOTE — Telephone Encounter (Signed)
 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   Called patient back regarding question.  Informed patient since we have not seen her since 2022  would not be able to refill medication at this time. Instructed patient to keep appointment scheduled with Dr. Felipe Horton on June 2 and would be able to discuss and evaluate at that time.   Patient verbalized understanding and denied further questions.

## 2023-12-07 NOTE — Progress Notes (Addendum)
    NURSE VISIT NOTE  Subjective:    Patient ID: Peggy Landry, female    DOB: 10/17/1985, 38 y.o.   MRN: [id number]  HPI  Patient is a 38 y.o. [id number] female who presents for clear vaginal discharge for 1 week(s). Denies abnormal vaginal bleeding or significant pelvic pain or fever. reports flank pain, abdominal pain, vaginal discharge, and urinary odor. Patient denies history of known exposure to STD.   Objective:    BP 130/86   Pulse 92   Ht 5\' 6"  (1.676 m)   Wt 163 lb 9.6 oz (74.2 kg)   LMP 11/27/2023 (Exact Date)   BMI 26.41 kg/m     Assessment:   1. Vaginal discharge   2. Screening for STDs (sexually transmitted diseases)   3. Abnormal urine odor     rule out GC or chlamydia and nonspecific vaginitis  Plan:   GC and chlamydia DNA  probe sent to lab. Treatment: abstain from coitus during course of treatment, history of re ROV prn if symptoms persist or worsen.   Inga Manges, CMA

## 2023-12-08 ENCOUNTER — Ambulatory Visit (INDEPENDENT_AMBULATORY_CARE_PROVIDER_SITE_OTHER)

## 2023-12-08 ENCOUNTER — Other Ambulatory Visit (HOSPITAL_COMMUNITY)
Admission: RE | Admit: 2023-12-08 | Discharge: 2023-12-08 | Disposition: A | Discharge status: Home / Self Care | Source: Ambulatory Visit | Attending: Obstetrics | Admitting: Obstetrics

## 2023-12-08 VITALS — BP 130/86 | HR 92 | Ht 66.0 in | Wt 163.6 lb

## 2023-12-08 DIAGNOSIS — Z113 Encounter for screening for infections with a predominantly sexual mode of transmission: Secondary | ICD-10-CM | POA: Diagnosis present

## 2023-12-08 DIAGNOSIS — R8290 Unspecified abnormal findings in urine: Secondary | ICD-10-CM

## 2023-12-08 DIAGNOSIS — N898 Other specified noninflammatory disorders of vagina: Secondary | ICD-10-CM | POA: Diagnosis present

## 2023-12-08 LAB — POCT URINALYSIS DIPSTICK
Bilirubin, UA: NEGATIVE
Blood, UA: NEGATIVE
Glucose, UA: NEGATIVE
Ketones, UA: NEGATIVE
Leukocytes, UA: NEGATIVE
Nitrite, UA: NEGATIVE
Protein, UA: NEGATIVE
Spec Grav, UA: <=1.005 — AB (ref 1.010–1.025)
Urobilinogen, UA: 0.2 U/dL
pH, UA: 8 — AB (ref 5.0–8.0)

## 2023-12-08 NOTE — Patient Instructions (Signed)
 Vaginal Infection (Bacterial Vaginosis): What to Know  Bacterial vaginosis is an infection of the vagina. It happens when the balance of normal germs (bacteria) in the vagina changes. If you don't get treated, it can make it easier for you to get other infections from sex. These are called sexually transmitted infections (STIs). If you're pregnant, you need to get treated right away. This infection can cause a baby to be born early or at a low birth weight. What are the causes? This infection happens when too many harmful germs grow in the vagina. You can't get this infection from toilet seats, bedsheets, swimming pools, or things that touch your vagina. What increases the risk? Having sex with a new person or more than one person. Having sex without protection. Douching. Having an intrauterine device (IUD). Smoking. Using drugs or drinking alcohol. These can lead you to do risky things. Taking certain antibiotics. Being pregnant. What are the signs or symptoms? Some females have no symptoms. Symptoms may include: A gray or white discharge from your vagina. It can be watery or foamy. A fishy smell. This can happen after sex or during your menstrual period. Itching in and around your vagina. Burning or pain when you pee. How is this treated? This infection is treated with antibiotics. These may be given to you as: A pill. A cream for your vagina. A medicine that you put into your vagina (suppository). If the infection comes back, you may need more antibiotics. Follow these instructions at home: Medicines Take your medicines as told. Take or use your antibiotics as told. Do not stop using them even if you start to feel better. General instructions If the person you have sex with is a female, tell her that you have this infection. She will need to follow up with her doctor. Female partners don't need to be treated. Do not have sex until you finish treatment. Drink more fluids as  told. Keep your vagina and butt clean. Wash these areas with warm water each day. Wipe from front to back after you poop. If you're breastfeeding a baby, talk to your doctor if you should keep doing so during treatment. How is this prevented? Self-care Do not douche. Do not use deodorant sprays on your vagina. Wear cotton underwear. Do not wear tight pants and pantyhose, especially in the summer. Safe sex Use condoms the correct way and every time you have sex. Use dental dams to protect yourself during oral sex. Limit how many people you have sex with. Get tested for STIs. The person you have sex with should also get tested. Drugs and alcohol Do not smoke, vape, or use nicotine or tobacco. Do not use drugs. Limit the amount of alcohol you drink because it can lead you to do risky things. Where to find more information To learn more: Go to TonerProm[employer].no. Click Health Topics A-Z. Type "bacterial vaginosis" in the search bar. American Sexual Health Association (ASHA): ashasexualhealth.org [place].S. Department of Health and [employer], Office on Women's Health: TravelLesson.ca Contact a doctor if: Your symptoms don't get better, even after treatment. You have more discharge or pain when you pee. You have a fever or chills. You have pain in your belly or in the area between your hips. You have pain during sex. You bleed from your vagina between menstrual periods. This information is not intended to replace advice given to you by your health care provider. Make sure you discuss any questions you have with your health care provider. Document  Revised: 12/23/2022 Document Reviewed: 12/23/2022 Elsevier Patient Education  2024 [employer].

## 2023-12-09 LAB — HEP, RPR, HIV PANEL
HIV Screen 4th Generation wRfx: NONREACTIVE
Hepatitis B Surface Ag: NEGATIVE
RPR Ser Ql: NONREACTIVE

## 2023-12-10 ENCOUNTER — Ambulatory Visit: Payer: Self-pay | Admitting: Obstetrics

## 2023-12-10 DIAGNOSIS — B9689 Other specified bacterial agents as the cause of diseases classified elsewhere: Secondary | ICD-10-CM

## 2023-12-10 DIAGNOSIS — N390 Urinary tract infection, site not specified: Secondary | ICD-10-CM

## 2023-12-10 LAB — CERVICOVAGINAL ANCILLARY ONLY
Bacterial Vaginitis (gardnerella): POSITIVE — AB
Candida Glabrata: NEGATIVE
Candida Vaginitis: NEGATIVE
Chlamydia: NEGATIVE
Comment: NEGATIVE
Comment: NEGATIVE
Comment: NEGATIVE
Comment: NEGATIVE
Comment: NEGATIVE
Comment: NORMAL
Neisseria Gonorrhea: NEGATIVE
Trichomonas: NEGATIVE

## 2023-12-10 MED ORDER — METRONIDAZOLE 500 MG PO TABS: 500.0000 mg | ORAL_TABLET | Freq: Two times a day (BID) | ORAL | 0 refills | Status: DC

## 2023-12-14 LAB — URINE CULTURE

## 2023-12-14 MED ORDER — NITROFURANTOIN MONOHYD MACRO 100 MG PO CAPS
100.0000 mg | ORAL_CAPSULE | Freq: Two times a day (BID) | ORAL | 1 refills | Status: DC
Start: 2023-12-14 — End: 2024-01-24

## 2023-12-16 ENCOUNTER — Emergency Department

## 2023-12-16 ENCOUNTER — Emergency Department
Admission: EM | Admit: 2023-12-16 | Discharge: 2023-12-16 | Disposition: A | Discharge status: Home / Self Care | Attending: Emergency Medicine | Admitting: Emergency Medicine

## 2023-12-16 ENCOUNTER — Other Ambulatory Visit: Payer: Self-pay

## 2023-12-16 DIAGNOSIS — M940 Chondrocostal junction syndrome [Tietze]: Secondary | ICD-10-CM | POA: Diagnosis not present

## 2023-12-16 DIAGNOSIS — R079 Chest pain, unspecified: Secondary | ICD-10-CM | POA: Diagnosis present

## 2023-12-16 LAB — CBC
HCT: 37 % (ref 36.0–46.0)
Hemoglobin: 13.1 g/dL (ref 12.0–15.0)
MCH: 33.2 pg (ref 26.0–34.0)
MCHC: 35.4 g/dL (ref 30.0–36.0)
MCV: 93.7 fL (ref 80.0–100.0)
Platelets: 186 10*3/uL (ref 150–400)
RBC: 3.95 MIL/uL (ref 3.87–5.11)
RDW: 13 % (ref 11.5–15.5)
WBC: 6.5 10*3/uL (ref 4.0–10.5)
nRBC: 0 % (ref 0.0–0.2)

## 2023-12-16 LAB — BASIC METABOLIC PANEL WITH GFR
Anion gap: 6 (ref 5–15)
BUN: 8 mg/dL (ref 6–20)
CO2: 23 mmol/L (ref 22–32)
Calcium: 8.9 mg/dL (ref 8.9–10.3)
Chloride: 107 mmol/L (ref 98–111)
Creatinine, Ser: 0.88 mg/dL (ref 0.44–1.00)
GFR, Estimated: >60 mL/min (ref >60)
Glucose, Bld: 127 mg/dL — ABNORMAL HIGH (ref 70–99)
Potassium: 3.4 mmol/L — ABNORMAL LOW (ref 3.5–5.1)
Sodium: 136 mmol/L (ref 135–145)

## 2023-12-16 LAB — TROPONIN I (HIGH SENSITIVITY): Troponin I (High Sensitivity): <2 ng/L (ref <18)

## 2023-12-16 LAB — POC URINE PREG, ED: Preg Test, Ur: NEGATIVE

## 2023-12-16 MED ORDER — KETOROLAC TROMETHAMINE 30 MG/ML IJ SOLN
30.0000 mg | Freq: Once | INTRAMUSCULAR | Status: AC
  Administered 2023-12-16: 30 mg via INTRAMUSCULAR
  Filled 2023-12-16: qty 1

## 2023-12-16 MED ORDER — DEXAMETHASONE SODIUM PHOSPHATE 10 MG/ML IJ SOLN
10.0000 mg | Freq: Once | INTRAMUSCULAR | Status: AC
  Administered 2023-12-16: 10 mg via INTRAMUSCULAR
  Filled 2023-12-16: qty 1

## 2023-12-16 MED ORDER — MELOXICAM 15 MG PO TABS
15.0000 mg | ORAL_TABLET | Freq: Every day | ORAL | 0 refills | Status: DC
Start: 2023-12-16 — End: 2024-01-24

## 2023-12-16 MED ORDER — PREDNISONE 50 MG PO TABS: 50.0000 mg | ORAL_TABLET | Freq: Every day | ORAL | 0 refills | Status: DC

## 2023-12-16 NOTE — ED Provider Notes (Signed)
 [facility] Provider Note  Patient Contact: 7:08 PM (approximate)   History   Chest Pain   HPI  Peggy Landry is a 38 y.o. female who presents to the emergency department with complaint of chest pain.  Patient states that she has had some intermittent chest pain in the past, has had detailed cardiac workups with cardiology and has never had any diagnosis.  Patient states that she has been increasing her workouts, had some arm pain on the right side before developing chest pain last night.  Chest pain is sort of migrated across the center chest now more into the left chest wall.  It is reproducible with palpation and worsened with movement.  Patient denies any palpitations, shortness of breath, GI symptoms.  She has had a cough that she is attributes to chronic bronchitis from smoking.  She states that while she is coughing she feels some mild shortness of breath but not at rest.     Physical Exam   Triage Vital Signs: ED Triage Vitals  Encounter Vitals Group     BP 12/16/23 1604 (!) 140/95     Systolic BP Percentile --      Diastolic BP Percentile --      Pulse Rate 12/16/23 1604 96     Resp 12/16/23 1604 20     Temp 12/16/23 1604 98.2 F (36.8 C)     Temp Source 12/16/23 1604 Oral     SpO2 12/16/23 1604 98 %     Weight --      Height --      Head Circumference --      Peak Flow --      Pain Score 12/16/23 1605 7     Pain Loc --      Pain Education --      Exclude from Growth Chart --     Most recent vital signs: Vitals:   12/16/23 1604  BP: (!) 140/95  Pulse: 96  Resp: 20  Temp: 98.2 F (36.8 C)  SpO2: 98%     General: Alert and in no acute distress.  Cardiovascular:  Good peripheral perfusion.  Normal S1 and S2 with no appreciable murmurs, rubs, gallops. Respiratory: Normal respiratory effort without tachypnea or retractions. Lungs CTAB. Good air entry to the bases with no decreased or absent breath sounds Musculoskeletal: Full  range of motion to all extremities.  Neurologic:  No gross focal neurologic deficits are appreciated.  Skin:   No rash noted Other:   ED Results / Procedures / Treatments   Labs (all labs ordered are listed, but only abnormal results are displayed) Labs Reviewed  BASIC METABOLIC PANEL WITH GFR - Abnormal; Notable for the following components:      Result Value   Potassium 3.4 (*)    Glucose, Bld 127 (*)    All other components within normal limits  CBC  POC URINE PREG, ED  TROPONIN I (HIGH SENSITIVITY)     EKG  ED ECG REPORT I, Ardath Bears Koi Yarbro,  personally viewed and interpreted this ECG.   Date: 12/16/2023  EKG Time: 1606 hrs.  Rate: 98 bpm  Rhythm: unchanged from previous tracings, normal sinus rhythm  Axis: Normal axis  Intervals:none  ST&T Change: No gross ST elevation or depression noted    RADIOLOGY  I personally viewed, evaluated, and interpreted these images as part of my medical decision making, as well as reviewing the written report by the radiologist.  ED Provider Interpretation:  No acute cardiopulmonary findings on chest x-ray.  DG Chest 2 View Result Date: 12/16/2023 CLINICAL DATA:  Chest pain. EXAM: CHEST - 2 VIEW COMPARISON:  Chest radiograph dated 05/25/2021. FINDINGS: The heart size and mediastinal contours are within normal limits. Both lungs are clear. The visualized skeletal structures are unremarkable. IMPRESSION: No active cardiopulmonary disease. Electronically Signed   By: Angus Bark M.D.   On: 12/16/2023 18:26    PROCEDURES:  Critical Care performed: No  Procedures   MEDICATIONS ORDERED IN ED: Medications  dexamethasone  (DECADRON ) injection 10 mg (has no administration in time range)  ketorolac  (TORADOL ) 30 MG/ML injection 30 mg (has no administration in time range)     IMPRESSION / MDM / ASSESSMENT AND PLAN / ED COURSE  I reviewed the triage vital signs and the nursing notes.                                  Differential diagnosis includes, but is not limited to, STEMI/NSTEMI, costochondritis, pneumonia, bronchitis, asthma exacerbation   Patient's presentation is most consistent with acute presentation with potential threat to life or bodily function.   Patient's diagnosis is consistent with costochondritis.  Patient presents to the emergency department with central and left-sided chest pain.  This is described more as chest wall pain and is reproducible with self palpation and palpation in the emergency department.  This reproduces patient's symptoms.  Patient reports some shortness of breath with coughing which she attributes to her chronic bronchitis.  There was no evidence of pneumonia or peribronchial thickening on chest x-ray.  EKG, troponins are reassuring.  Given the reproducible nature of chest pain with palpation I do suspect that this is chest wall pain.  Patient has been having increased activity as well as some increased coughing from her chronic bronchitis.  Suspect that this is led to her costochondritis.  She will be placed on short burst of prednisone  and meloxicam for costochondritis and chronic bronchitic symptoms controlled.  She will use her inhaler as needed at home.  She can follow-up with primary care or cardiology as needed.  Concerning signs and symptoms and return precautions discussed with the patient..  Patient is given ED precautions to return to the ED for any worsening or new symptoms.     FINAL CLINICAL IMPRESSION(S) / ED DIAGNOSES   Final diagnoses:  Costochondritis     Rx / DC Orders   ED Discharge Orders          Ordered    predniSONE  (DELTASONE ) 50 MG tablet  Daily with breakfast        12/16/23 1914    meloxicam (MOBIC) 15 MG tablet  Daily        12/16/23 1914             Note:  This document was prepared using Dragon voice recognition software and may include unintentional dictation errors.   Annia Kilts 12/16/23 1914     Twilla Galea, MD 12/16/23 [phone]

## 2023-12-16 NOTE — ED Triage Notes (Addendum)
 Pt to ED via POV from home. Pt ambulatory to triage. Pt reports right arm pain that started 2 days ago and left sided CP that started last pm. Pt also reports SOB. Pt is everyday smoker

## 2023-12-20 ENCOUNTER — Ambulatory Visit (INDEPENDENT_AMBULATORY_CARE_PROVIDER_SITE_OTHER): Admitting: Dermatology

## 2023-12-20 DIAGNOSIS — Z79899 Other long term (current) drug therapy: Secondary | ICD-10-CM

## 2023-12-20 DIAGNOSIS — L750 Bromhidrosis: Secondary | ICD-10-CM

## 2023-12-20 DIAGNOSIS — L74519 Primary focal hyperhidrosis, unspecified: Secondary | ICD-10-CM

## 2023-12-20 MED ORDER — QBREXZA 2.4 % EX PADS: MEDICATED_PAD | CUTANEOUS | 11 refills | Status: DC

## 2023-12-20 MED ORDER — CLINDAMYCIN PHOSPHATE 1 % EX LOTN: TOPICAL_LOTION | Freq: Two times a day (BID) | CUTANEOUS | 11 refills | Status: AC

## 2023-12-20 MED ORDER — GLYCOPYRROLATE 1 MG PO TABS: 2.0000 mg | ORAL_TABLET | Freq: Two times a day (BID) | ORAL | 11 refills | Status: DC

## 2023-12-20 NOTE — Progress Notes (Signed)
   Follow-Up Visit   Subjective  Peggy Landry is a 38 y.o. female who presents for the following: Hyperhidrosis, patient needing refills on clindamycin  lotion and glycopyrrolate  1mg  patient was taking two in the morning and two at night. Patient reports in the beginning she was not taking the prescription as she was supposed to and was taking more, she was then having side effects like UTIs and chest pains, also was having dry mouth.   The patient has spots, moles and lesions to be evaluated, some may be new or changing and the patient may have concern these could be cancer.   The following portions of the chart were reviewed this encounter and updated as appropriate: medications, allergies, medical history  Review of Systems:  No other skin or systemic complaints except as noted in HPI or Assessment and Plan.  Objective  Well appearing patient in no apparent distress; mood and affect are within normal limits.   A focused examination was performed of the following areas: Areas not examined today  Relevant exam findings are noted in the Assessment and Plan.    Assessment & Plan    PRIMARY FOCAL HYPERHIDROSIS Axilla, inguinal creases, buttocks crease Chronic and persistent condition with duration or expected duration over one year. Condition is symptomatic/ bothersome to patient. Not currently at goal.   Continue glycopyrrolate  2mg  BID, if too drying decrease to taking 1mg  BID. Proceed to ED having difficulty with urination.   Glycopyrrolate  can significantly increase the risk of heat stroke so you should avoid using it in the heat, particularly while active. It can also cause dry mouth, blurred vision, difficulty with urination, headache, constipation, and racing heart. Take it only as directed. Never take more than 8 tablets total per day.    Continue clindamycin  1% lotion apply topically BID to aa.   Use QBREXZA pads one wipe and apply to each armpit once per day.  Patient  advised to not get if too expensive. Samples given to patient x2. Rx sent to [facility]. If covered, can use instead of oral glycopyrrolate  to minimize dryness side effect  NDC: 40981-191-47 Exp: 08/2025 Lot: [id number]  Your prescription was sent to [facility] in [place]. A representative from [facility] will contact you within 3 business hours to verify your address and insurance information to schedule a free delivery. If for any reason you do not receive a phone call from them, please reach out to them. Their phone number is [phone] and their hours are Monday-Friday 9:00 am-5:00 pm.   Related Medications glycopyrrolate  (ROBINUL ) 1 MG tablet Take 2 tablets (2 mg total) by mouth 2 (two) times daily. Take 2mg  BID, if too drying decrease to 1mg  BID. Glycopyrronium Tosylate (QBREXZA) 2.4 % PADS Use one wipe and apply to each armpit once per day BROMHIDROSIS  Patient reports improvement with clindamycin  Related Medications clindamycin  (CLEOCIN -T) 1 % lotion Apply topically 2 (two) times daily.  Return in about 1 year (around 12/19/2024) for hyperhidrosis.  I, Jacquelynn V. Grier Leber, CMA, am acting as scribe for Harris Liming, MD .   Documentation: I have reviewed the above documentation for accuracy and completeness, and I agree with the above.  Harris Liming, MD

## 2023-12-20 NOTE — Patient Instructions (Addendum)
 Your prescription was sent to [facility] in [place]. A representative from [facility] will contact you within 3 business hours to verify your address and insurance information to schedule a free delivery. If for any reason you do not receive a phone call from them, please reach out to them. Their phone number is [phone] and their hours are Monday-Friday 9:00 am-5:00 pm.     Due to recent changes in healthcare laws, you may see results of your pathology and/or laboratory studies on MyChart before the doctors have had a chance to review them. We understand that in some cases there may be results that are confusing or concerning to you. Please understand that not all results are received at the same time and often the doctors may need to interpret multiple results in order to provide you with the best plan of care or course of treatment. Therefore, we ask that you please give [place] 2 business days to thoroughly review all your results before contacting the office for clarification. Should we see a critical lab result, you will be contacted sooner.   If You Need Anything After Your Visit  If you have any questions or concerns for your doctor, please call our main line at [phone] and press option 4 to reach your doctor's medical assistant. If no one answers, please leave a voicemail as directed and we will return your call as soon as possible. Messages left after 4 pm will be answered the following business day.   You may also send [place] a message via MyChart. We typically respond to MyChart messages within 1-2 business days.  For prescription refills, please ask your pharmacy to contact our office. Our fax number is [phone].  If you have an urgent issue when the clinic is closed that cannot wait until the next business day, you can page your doctor at the number below.    Please note that while we do our best to be available for urgent issues outside of office hours, we are not  available 24/7.   If you have an urgent issue and are unable to reach [place], you may choose to seek medical care at your doctor's office, retail clinic, urgent care center, or emergency room.  If you have a medical emergency, please immediately call 911 or go to the emergency department.  Pager Numbers  - Dr. Gwen Pounds: [phone]  - Dr. Roseanne Reno: [phone]  - Dr. Katrinka Blazing: [phone]   In the event of inclement weather, please call our main line at [phone] for an update on the status of any delays or closures.  Dermatology Medication Tips: Please keep the boxes that topical medications come in in order to help keep track of the instructions about where and how to use these. Pharmacies typically print the medication instructions only on the boxes and not directly on the medication tubes.   If your medication is too expensive, please contact our office at [phone] option 4 or send [place] a message through MyChart.   We are unable to tell what your co-pay for medications will be in advance as this is different depending on your insurance coverage. However, we may be able to find a substitute medication at lower cost or fill out paperwork to get insurance to cover a needed medication.   If a prior authorization is required to get your medication covered by your insurance company, please allow [place] 1-2 business days to complete this process.  Drug prices often vary depending on where the prescription is  filled and some pharmacies may offer cheaper prices.  The website www.goodrx.com contains coupons for medications through different pharmacies. The prices here do not account for what the cost may be with help from insurance (it may be cheaper with your insurance), but the website can give you the price if you did not use any insurance.  - You can print the associated coupon and take it with your prescription to the pharmacy.  - You may also stop by our office during regular business hours  and pick up a GoodRx coupon card.  - If you need your prescription sent electronically to a different pharmacy, notify our office through [facility] or by phone at [phone] option 4.     Si Usted Necesita Algo Despus de Su Visita  Tambin puede enviarnos un mensaje a travs de Clinical cytogeneticist. Por lo general respondemos a los mensajes de MyChart en el transcurso de 1 a 2 das hbiles.  Para renovar recetas, por favor pida a su farmacia que se ponga en contacto con nuestra oficina. Annie Sable de fax es [place] [phone].  Si tiene un asunto urgente cuando la clnica est cerrada y que no puede esperar hasta el siguiente da hbil, puede llamar/localizar a su doctor(a) al nmero que aparece a continuacin.   Por favor, tenga en cuenta que aunque hacemos todo lo posible para estar disponibles para asuntos urgentes fuera del horario de [place], no estamos disponibles las 24 horas del da, los 7 [address]  [address] de la [place].   Si tiene un problema urgente y no puede comunicarse con nosotros, puede optar por buscar atencin mdica  en el consultorio de su doctor(a), en una clnica privada, en un centro de atencin urgente o en una sala de emergencias.  Si tiene Engineer, drilling, por favor llame inmediatamente al 911 o vaya a la sala de emergencias.  Nmeros de bper  - Dr. Gwen Pounds: [phone]  - Dra. Roseanne Reno: [phone]  - Dr. Katrinka Blazing: [phone]   En caso de inclemencias del tiempo, por favor llame a Lacy Duverney principal al [phone] para una actualizacin sobre el [place] de cualquier retraso o cierre.  Consejos para la medicacin en dermatologa: Por favor, guarde las cajas en las que vienen los medicamentos de uso tpico para ayudarle a seguir las instrucciones sobre dnde y cmo usarlos. Las farmacias generalmente imprimen las instrucciones del medicamento slo en las cajas y no directamente en los tubos del [place].   Si su medicamento es muy caro, por favor, pngase  en contacto con Rolm Gala llamando al [phone] y presione la opcin 4 o envenos un mensaje a travs de Clinical cytogeneticist.   No podemos decirle cul ser su copago por los medicamentos por adelantado ya que esto es diferente dependiendo de la cobertura de su seguro. Sin embargo, es posible que podamos encontrar un medicamento sustituto a Audiological scientist un formulario para que el seguro cubra el medicamento que se considera necesario.   Si se requiere una autorizacin previa para que su compaa de seguros [place] su medicamento, por favor permtanos de 1 a 2 das hbiles para completar [address]  Los precios de los medicamentos varan con frecuencia dependiendo del Environmental consultant de dnde se surte la receta y alguna farmacias pueden ofrecer precios ms baratos.  El sitio web www.goodrx.com tiene cupones para medicamentos de Health and safety inspector. Los precios aqu no tienen en cuenta lo que podra costar con la ayuda del seguro (puede ser ms barato con su seguro), pero el sitio web puede  darle el precio si no utiliz [employer].  - Puede imprimir el cupn correspondiente y llevarlo con su receta a la farmacia.  - Tambin puede pasar por nuestra oficina durante el horario de atencin regular y Education officer, museum una tarjeta de cupones de GoodRx.  - Si necesita que su receta se enve electrnicamente a una farmacia diferente, informe a nuestra oficina a travs de MyChart de [place] o por telfono llamando al [phone] y presione la opcin 4.

## 2023-12-21 ENCOUNTER — Encounter: Payer: Self-pay | Admitting: Dermatology

## 2023-12-28 ENCOUNTER — Encounter: Payer: Self-pay | Admitting: Obstetrics and Gynecology

## 2023-12-28 ENCOUNTER — Other Ambulatory Visit (HOSPITAL_COMMUNITY)
Admission: RE | Admit: 2023-12-28 | Discharge: 2023-12-28 | Disposition: A | Discharge status: Home / Self Care | Source: Ambulatory Visit | Attending: Obstetrics and Gynecology | Admitting: Obstetrics and Gynecology

## 2023-12-28 ENCOUNTER — Ambulatory Visit (INDEPENDENT_AMBULATORY_CARE_PROVIDER_SITE_OTHER): Admitting: Obstetrics and Gynecology

## 2023-12-28 ENCOUNTER — Other Ambulatory Visit: Payer: Self-pay

## 2023-12-28 VITALS — BP 122/80 | HR 98

## 2023-12-28 DIAGNOSIS — Z87440 Personal history of urinary (tract) infections: Secondary | ICD-10-CM

## 2023-12-28 DIAGNOSIS — Z01818 Encounter for other preprocedural examination: Secondary | ICD-10-CM

## 2023-12-28 DIAGNOSIS — N898 Other specified noninflammatory disorders of vagina: Secondary | ICD-10-CM

## 2023-12-28 LAB — POCT URINALYSIS DIP (CLINITEK)
Bilirubin, UA: NEGATIVE
Blood, UA: NEGATIVE
Glucose, UA: NEGATIVE mg/dL
Ketones, POC UA: NEGATIVE mg/dL
Leukocytes, UA: NEGATIVE
Nitrite, UA: NEGATIVE
POC PROTEIN,UA: NEGATIVE
Spec Grav, UA: 1.02 (ref 1.010–1.025)
Urobilinogen, UA: 0.2 U/dL
pH, UA: 7 (ref 5.0–8.0)

## 2023-12-28 LAB — POCT PREGNANCY, URINE

## 2023-12-28 MED ORDER — POLYETHYLENE GLYCOL 3350 17 GM/SCOOP PO POWD: 17.0000 g | Freq: Every day | ORAL | 0 refills | Status: AC

## 2023-12-28 MED ORDER — OXYCODONE HCL 5 MG PO TABS: 5.0000 mg | ORAL_TABLET | ORAL | 0 refills | Status: DC | PRN

## 2023-12-28 MED ORDER — ACETAMINOPHEN 500 MG PO TABS: 500.0000 mg | ORAL_TABLET | Freq: Four times a day (QID) | ORAL | 0 refills | Status: AC | PRN

## 2023-12-28 MED ORDER — IBUPROFEN 600 MG PO TABS: 600.0000 mg | ORAL_TABLET | Freq: Four times a day (QID) | ORAL | 0 refills | Status: AC | PRN

## 2023-12-28 NOTE — Progress Notes (Signed)
 [facility] Pre-Operative Exam  Subjective Chief Complaint: Peggy Landry presents for a preoperative encounter.   History of Present Illness: Peggy Landry is a 38 y.o. female who presents for preoperative visit.  She is scheduled to undergo Exam under anesthesia, Skeene's gland cyst excision, and cystoscopy on 01/24/24.  Her symptoms include urgency urinary incontinence, recurrent UTI, and BV.   Urodynamics showed: Deferred  Past Medical History:  Diagnosis Date   Anxiety    Depression    Herpes genitalis in women      Past Surgical History:  Procedure Laterality Date   WISDOM TOOTH EXTRACTION      has no known allergies.   Family History  Problem Relation Age of Onset   Hypertension Mother    Dementia Father    COPD Father    Heart disease Paternal Grandmother    Breast cancer Neg Hx     Social History   Tobacco Use   Smoking status: Every Day    Current packs/day: 1.00    Types: Cigarettes   Smokeless tobacco: Never   Tobacco comments:    Quitline info given and refer to PCP for meds.  Vaping Use   Vaping status: Never Used  Substance Use Topics   Alcohol use: Yes    Alcohol/week: 4.0 standard drinks of alcohol    Types: 3 Cans of beer, 1 Standard drinks or equivalent per week    Comment: Once a week   Drug use: Not Currently    Types: Marijuana, Cocaine    Comment: last use 07/2020 cocaine/     Review of Systems was negative for a full 10 system review except as noted in the History of Present Illness.   Current Outpatient Medications:    acetaminophen  (TYLENOL ) 500 MG tablet, Take 1 tablet (500 mg total) by mouth every 6 (six) hours as needed (pain)., Disp: 30 tablet, Rfl: 0   ibuprofen (ADVIL) 600 MG tablet, Take 1 tablet (600 mg total) by mouth every 6 (six) hours as needed., Disp: 30 tablet, Rfl: 0   oxyCODONE (OXY IR/ROXICODONE) 5 MG immediate release tablet, Take 1 tablet (5 mg total) by mouth every 4 (four) hours as needed  for severe pain (pain score 7-10)., Disp: 10 tablet, Rfl: 0   polyethylene glycol powder (GLYCOLAX/MIRALAX) 17 GM/SCOOP powder, Take 17 g by mouth daily. Drink 17g (1 scoop) dissolved in water per day., Disp: 255 g, Rfl: 0   albuterol  (VENTOLIN  HFA) 108 (90 Base) MCG/ACT inhaler, Inhale 2 puffs into the lungs every 6 (six) hours as needed for wheezing or shortness of breath., Disp: 16 g, Rfl: 1   aspirin-sod bicarb-citric acid (ALKA-SELTZER) 325 MG TBEF tablet, Take 325 mg by mouth every 6 (six) hours as needed., Disp: , Rfl:    buPROPion (WELLBUTRIN XL) 300 MG 24 hr tablet, Take 300 mg by mouth daily. (Patient not taking: Reported on 12/08/2023), Disp: , Rfl:    clindamycin  (CLEOCIN -T) 1 % lotion, Apply topically 2 (two) times daily., Disp: 60 mL, Rfl: 11   glycopyrrolate  (ROBINUL ) 1 MG tablet, Take 2 tablets (2 mg total) by mouth 2 (two) times daily. Take 2mg  BID, if too drying decrease to 1mg  BID., Disp: 120 tablet, Rfl: 11   Glycopyrronium Tosylate  (QBREXZA ) 2.4 % PADS, Use one wipe and apply to each armpit once per day, Disp: 30 each, Rfl: 11   hydrOXYzine (VISTARIL) 25 MG capsule, Take 25 mg by mouth daily., Disp: , Rfl:    meloxicam  (MOBIC )  15 MG tablet, Take 1 tablet (15 mg total) by mouth daily., Disp: 30 tablet, Rfl: 0   metroNIDAZOLE  (FLAGYL ) 500 MG tablet, Take 1 tablet (500 mg total) by mouth 2 (two) times daily., Disp: 14 tablet, Rfl: 0   naproxen (NAPROSYN) 500 MG tablet, NAPROXEN 500 MG TABS (Patient not taking: Reported on 12/20/2023), Disp: , Rfl:    nitrofurantoin , macrocrystal-monohydrate, (MACROBID ) 100 MG capsule, Take 1 capsule (100 mg total) by mouth 2 (two) times daily., Disp: 14 capsule, Rfl: 1   OVER THE COUNTER MEDICATION, Take 1 capsule by mouth daily. chlorophyll pills, Disp: , Rfl:    predniSONE  (DELTASONE ) 50 MG tablet, Take 1 tablet (50 mg total) by mouth daily with breakfast., Disp: 5 tablet, Rfl: 0   Probiotic Product (PROBIOTIC PO), Take 1 capsule by mouth daily.,  Disp: , Rfl:    Pseudoeph-CPM-DM-APAP (TYLENOL  COLD & FLU DAY/NIGHT PO), Take 2 capsules by mouth as needed. (Patient not taking: Reported on 12/20/2023), Disp: , Rfl:    Vibegron  (GEMTESA ) 75 MG TABS, Take 75 mg by mouth daily. (Patient not taking: Reported on 12/08/2023), Disp: , Rfl:    Objective Vitals:   12/28/23 1451  BP: 122/80  Pulse: 98    Gen: NAD CV: S1 S2 RRR Lungs: Clear to auscultation bilaterally Abd: soft, nontender   Previous Pelvic Exam showed: Physical Exam Constitutional:      General: She is not in acute distress.    Appearance: Normal appearance.  Genitourinary:     Bladder and urethral meatus normal.     No lesions in the vagina.     Genitourinary Comments: 1cm smooth wall, well circumscribed fluid filled lesion at the posterior urethral meatus. No pain or purulent discharge expressed with palpation.      Right Labia: No rash, tenderness, lesions, skin changes or Bartholin's cyst.    Left Labia: No tenderness, lesions, skin changes, Bartholin's cyst or rash.         Vaginal discharge (brown due to menses and scant clear) present.     No vaginal erythema, tenderness, bleeding, ulceration or granulation tissue.     No vaginal prolapse present.    No vaginal atrophy present.      Right Adnexa: not tender, not full and no mass present.    Left Adnexa: not tender, not full and no mass present.    No cervical motion tenderness, discharge, friability, lesion, polyp or nabothian cyst.     Uterus is not enlarged, fixed, tender, irregular or prolapsed.     No uterine mass detected.    Urethral meatus caruncle not present.    Urethral mass present.     No urethral prolapse, tenderness, hypermobility, discharge or stress urinary incontinence with cough stress test present.     Bladder is not tender, urgency on palpation not present and masses not present.      Levator ani not tender, obturator internus not tender, no asymmetrical contractions present and no pelvic  spasms present.    Symmetrical pelvic sensation, anal wink present and BC reflex present. Cardiovascular:     Rate and Rhythm: Normal rate.  Pulmonary:     Effort: Pulmonary effort is normal. No respiratory distress.  Abdominal:     General: Abdomen is flat. There is no distension.     Palpations: Abdomen is soft. There is no mass.     Tenderness: There is no abdominal tenderness. There is no right CVA tenderness or left CVA tenderness.     Hernia: No  hernia is present.  Neurological:     Mental Status: She is alert.  Vitals reviewed. Exam conducted with a chaperone present.      Media Information      Assessment/ Plan  Assessment: The patient is a 38 y.o. year old scheduled to undergo Exam under anesthesia, Skeene's gland cyst excision, and cystoscopy. Verbal consent was obtained for these procedures.  Plan: General Surgical Consent: The patient has previously been counseled on alternative treatments, and the decision by the patient and provider was to proceed with the procedure listed above.  For all procedures, there are risks of bleeding, infection, damage to surrounding organs including but not limited to bowel, bladder, blood vessels, ureters and nerves, and need for further surgery if an injury were to occur. These risks are all low with minimally invasive surgery.   There are risks of numbness and weakness at any body site or buttock/rectal pain.  It is possible that baseline pain can be worsened by surgery, either with or without mesh. If surgery is vaginal, there is also a low risk of possible conversion to laparoscopy or open abdominal incision where indicated. Very rare risks include blood transfusion, blood clot, heart attack, pneumonia, or death.   There is also a risk of short-term postoperative urinary retention with need to use a catheter. About half of patients need to go home from surgery with a catheter, which is then later removed in the office. The risk of  long-term need for a catheter is very low. There is also a risk of worsening of overactive bladder.    We discussed consent for blood products. Risks for blood transfusion include allergic reactions, other reactions that can affect different body organs and managed accordingly, transmission of infectious diseases such as HIV or Hepatitis. However, the blood is screened. Patient consents for blood products.  Pre-operative instructions:  She was instructed to not take Aspirin/NSAIDs x 7days prior to surgery. Encouraged to hold garlic capsules. Antibiotic prophylaxis was ordered as indicated.  Catheter use: Patient will go home with foley if needed after post-operative voiding trial.  Post-operative instructions:  She was provided with specific post-operative instructions, including precautions and signs/symptoms for which we would recommend contacting us , in addition to daytime and after-hours contact phone numbers. This was provided on a handout.   Post-operative medications: Prescriptions for motrin, tylenol , miralax, and oxycodone were sent to her pharmacy. Discussed using ibuprofen and tylenol  on a schedule to limit use of narcotics.   Laboratory testing:  We will check labs: Pregnancy test  Preoperative clearance:  She does not require surgical clearance.    Post-operative follow-up:  A post-operative appointment will be made for 6 weeks from the date of surgery. If she needs a post-operative nurse visit for a voiding trial, that will be set up after she leaves the hospital.    Patient will call the clinic or use MyChart should anything change or any new issues arise.   Shaya Reddick G Jannely Henthorn, NP

## 2023-12-28 NOTE — Addendum Note (Signed)
 Addended by: Graciela Lava on: 12/28/2023 03:53 PM   Modules accepted: Orders

## 2023-12-28 NOTE — H&P (Signed)
 [facility] H&P  Subjective Chief Complaint: Peggy Landry presents for a preoperative encounter.   History of Present Illness: Peggy Landry is a 37 y.o. female who presents for preoperative visit.  She is scheduled to undergo Exam under anesthesia, Skeene's gland cyst excision, and cystoscopy on 01/24/24.  Her symptoms include urgency urinary incontinence, recurrent UTI, and BV.   Urodynamics showed: Deferred  Past Medical History:  Diagnosis Date   Anxiety    Depression    Herpes genitalis in women      Past Surgical History:  Procedure Laterality Date   WISDOM TOOTH EXTRACTION      has no known allergies.   Family History  Problem Relation Age of Onset   Hypertension Mother    Dementia Father    COPD Father    Heart disease Paternal Grandmother    Breast cancer Neg Hx     Social History   Tobacco Use   Smoking status: Every Day    Current packs/day: 1.00    Types: Cigarettes   Smokeless tobacco: Never   Tobacco comments:    Quitline info given and refer to PCP for meds.  Vaping Use   Vaping status: Never Used  Substance Use Topics   Alcohol use: Yes    Alcohol/week: 4.0 standard drinks of alcohol    Types: 3 Cans of beer, 1 Standard drinks or equivalent per week    Comment: Once a week   Drug use: Not Currently    Types: Marijuana, Cocaine    Comment: last use 07/2020 cocaine/     Review of Systems was negative for a full 10 system review except as noted in the History of Present Illness.  No current facility-administered medications for this encounter.  Current Outpatient Medications:    acetaminophen  (TYLENOL ) 500 MG tablet, Take 1 tablet (500 mg total) by mouth every 6 (six) hours as needed (pain)., Disp: 30 tablet, Rfl: 0   albuterol  (VENTOLIN  HFA) 108 (90 Base) MCG/ACT inhaler, Inhale 2 puffs into the lungs every 6 (six) hours as needed for wheezing or shortness of breath., Disp: 16 g, Rfl: 1   aspirin-sod bicarb-citric acid  (ALKA-SELTZER) 325 MG TBEF tablet, Take 325 mg by mouth every 6 (six) hours as needed., Disp: , Rfl:    buPROPion (WELLBUTRIN XL) 300 MG 24 hr tablet, Take 300 mg by mouth daily. (Patient not taking: Reported on 12/08/2023), Disp: , Rfl:    clindamycin  (CLEOCIN -T) 1 % lotion, Apply topically 2 (two) times daily., Disp: 60 mL, Rfl: 11   glycopyrrolate  (ROBINUL ) 1 MG tablet, Take 2 tablets (2 mg total) by mouth 2 (two) times daily. Take 2mg  BID, if too drying decrease to 1mg  BID., Disp: 120 tablet, Rfl: 11   Glycopyrronium Tosylate  (QBREXZA ) 2.4 % PADS, Use one wipe and apply to each armpit once per day, Disp: 30 each, Rfl: 11   hydrOXYzine (VISTARIL) 25 MG capsule, Take 25 mg by mouth daily., Disp: , Rfl:    ibuprofen (ADVIL) 600 MG tablet, Take 1 tablet (600 mg total) by mouth every 6 (six) hours as needed., Disp: 30 tablet, Rfl: 0   meloxicam  (MOBIC ) 15 MG tablet, Take 1 tablet (15 mg total) by mouth daily., Disp: 30 tablet, Rfl: 0   metroNIDAZOLE  (FLAGYL ) 500 MG tablet, Take 1 tablet (500 mg total) by mouth 2 (two) times daily., Disp: 14 tablet, Rfl: 0   naproxen (NAPROSYN) 500 MG tablet, NAPROXEN 500 MG TABS (Patient not taking: Reported on 12/20/2023),  Disp: , Rfl:    nitrofurantoin , macrocrystal-monohydrate, (MACROBID ) 100 MG capsule, Take 1 capsule (100 mg total) by mouth 2 (two) times daily., Disp: 14 capsule, Rfl: 1   OVER THE COUNTER MEDICATION, Take 1 capsule by mouth daily. chlorophyll pills, Disp: , Rfl:    oxyCODONE (OXY IR/ROXICODONE) 5 MG immediate release tablet, Take 1 tablet (5 mg total) by mouth every 4 (four) hours as needed for severe pain (pain score 7-10)., Disp: 10 tablet, Rfl: 0   polyethylene glycol powder (GLYCOLAX/MIRALAX) 17 GM/SCOOP powder, Take 17 g by mouth daily. Drink 17g (1 scoop) dissolved in water per day., Disp: 255 g, Rfl: 0   predniSONE  (DELTASONE ) 50 MG tablet, Take 1 tablet (50 mg total) by mouth daily with breakfast., Disp: 5 tablet, Rfl: 0   Probiotic Product  (PROBIOTIC PO), Take 1 capsule by mouth daily., Disp: , Rfl:    Pseudoeph-CPM-DM-APAP (TYLENOL  COLD & FLU DAY/NIGHT PO), Take 2 capsules by mouth as needed. (Patient not taking: Reported on 12/20/2023), Disp: , Rfl:    Vibegron  (GEMTESA ) 75 MG TABS, Take 75 mg by mouth daily. (Patient not taking: Reported on 12/08/2023), Disp: , Rfl:    Objective There were no vitals filed for this visit.   Gen: NAD CV: S1 S2 RRR Lungs: Clear to auscultation bilaterally Abd: soft, nontender   Previous Pelvic Exam showed: Physical Exam Constitutional:      General: She is not in acute distress.    Appearance: Normal appearance.  Genitourinary:     Bladder and urethral meatus normal.     No lesions in the vagina.     Genitourinary Comments: 1cm smooth wall, well circumscribed fluid filled lesion at the posterior urethral meatus. No pain or purulent discharge expressed with palpation.      Right Labia: No rash, tenderness, lesions, skin changes or Bartholin's cyst.    Left Labia: No tenderness, lesions, skin changes, Bartholin's cyst or rash.         Vaginal discharge (brown due to menses and scant clear) present.     No vaginal erythema, tenderness, bleeding, ulceration or granulation tissue.     No vaginal prolapse present.    No vaginal atrophy present.      Right Adnexa: not tender, not full and no mass present.    Left Adnexa: not tender, not full and no mass present.    No cervical motion tenderness, discharge, friability, lesion, polyp or nabothian cyst.     Uterus is not enlarged, fixed, tender, irregular or prolapsed.     No uterine mass detected.    Urethral meatus caruncle not present.    Urethral mass present.     No urethral prolapse, tenderness, hypermobility, discharge or stress urinary incontinence with cough stress test present.     Bladder is not tender, urgency on palpation not present and masses not present.      Levator ani not tender, obturator internus not tender, no  asymmetrical contractions present and no pelvic spasms present.    Symmetrical pelvic sensation, anal wink present and BC reflex present. Cardiovascular:     Rate and Rhythm: Normal rate.  Pulmonary:     Effort: Pulmonary effort is normal. No respiratory distress.  Abdominal:     General: Abdomen is flat. There is no distension.     Palpations: Abdomen is soft. There is no mass.     Tenderness: There is no abdominal tenderness. There is no right CVA tenderness or left CVA tenderness.  Hernia: No hernia is present.  Neurological:     Mental Status: She is alert.  Vitals reviewed. Exam conducted with a chaperone present.      Media Information      Assessment/ Plan  Assessment: The patient is a 38 y.o. year old scheduled to undergo Exam under anesthesia, Skeene's gland cyst excision, and cystoscopy. Verbal consent was obtained for these procedures.

## 2023-12-29 ENCOUNTER — Ambulatory Visit: Payer: Self-pay | Admitting: Obstetrics and Gynecology

## 2023-12-29 LAB — CERVICOVAGINAL ANCILLARY ONLY
Bacterial Vaginitis (gardnerella): NEGATIVE
Candida Glabrata: NEGATIVE
Candida Vaginitis: NEGATIVE
Chlamydia: NEGATIVE
Comment: NEGATIVE
Comment: NEGATIVE
Comment: NEGATIVE
Comment: NEGATIVE
Comment: NEGATIVE
Comment: NORMAL
Neisseria Gonorrhea: NEGATIVE
Trichomonas: NEGATIVE

## 2024-01-03 ENCOUNTER — Ambulatory Visit (INDEPENDENT_AMBULATORY_CARE_PROVIDER_SITE_OTHER): Admitting: Dermatology

## 2024-01-03 ENCOUNTER — Encounter: Payer: Self-pay | Admitting: Dermatology

## 2024-01-03 DIAGNOSIS — R21 Rash and other nonspecific skin eruption: Secondary | ICD-10-CM

## 2024-01-03 MED ORDER — TRIAMCINOLONE ACETONIDE 0.1 % EX CREA
1.0000 | TOPICAL_CREAM | Freq: Two times a day (BID) | CUTANEOUS | 1 refills | Status: AC | PRN
Start: 2024-01-03 — End: ?

## 2024-01-03 NOTE — Progress Notes (Signed)
   Follow Up Visit   Subjective  Peggy Landry is a 38 y.o. female who presents for the following: Rash  At chest and neck. Started about 1 week ago, started off itchy and pt used [facility] medicated ointment with fungal cream. Rash seems to be improving. Patient did start using a new all over spray deodorant. No recent illnesses.  The following portions of the chart were reviewed this encounter and updated as appropriate: medications, allergies, medical history  Review of Systems:  No other skin or systemic complaints except as noted in HPI or Assessment and Plan.  Objective  Well appearing patient in no apparent distress; mood and affect are within normal limits.  A focused examination was performed of the following areas: Chest, neck, face, legs  Relevant exam findings are noted in the Assessment and Plan.    Assessment & Plan          RASH AND OTHER NONSPECIFIC SKIN ERUPTION   Related Medications triamcinolone cream (KENALOG) 0.1 % Apply 1 Application topically 2 (two) times daily as needed (Rash). Until smooth. Avoid applying to face, groin, and axilla. Use as directed. Long-term use can cause thinning of the skin.  RASH Exam: circular pink scaly plaques on chest L neck L shoulder  Ddx: eczematous dermatitis (allergic contact from deodorant vs poison ivy exposure? vs atopic) vs pityriasis rosea  Treatment Plan: Start TMC 0.1% cr twice daily until smooth. Use up to 2 weeks. Avoid applying to face, groin, and axilla. Use as directed. Long-term use can cause thinning of the skin. Dyspigmentation will self-resolve when inflammation is gone Offered biopsy. Patient prefers empiric treatment. Consider biopsy if not improving.   Topical steroids (such as triamcinolone, fluocinolone, fluocinonide, mometasone, clobetasol, halobetasol, betamethasone, hydrocortisone) can cause thinning and lightening of the skin if they are used for too long in the same area. Your physician has  selected the right strength medicine for your problem and area affected on the body. Please use your medication only as directed by your physician to prevent side effects.     Return if symptoms worsen or fail to improve.  Kerstin Peeling, RMA, am acting as scribe for Harris Liming, MD .   Documentation: I have reviewed the above documentation for accuracy and completeness, and I agree with the above.  Harris Liming, MD

## 2024-01-03 NOTE — Patient Instructions (Signed)
 Treatment Plan: Start TMC 0.1% cr twice daily until smooth. Avoid applying to face, groin, and axilla. Use as directed. Long-term use can cause thinning of the skin.  Consider biopsy if not improving.   Topical steroids (such as triamcinolone, fluocinolone, fluocinonide, mometasone, clobetasol, halobetasol, betamethasone, hydrocortisone) can cause thinning and lightening of the skin if they are used for too long in the same area. Your physician has selected the right strength medicine for your problem and area affected on the body. Please use your medication only as directed by your physician to prevent side effects.   Due to recent changes in healthcare laws, you may see results of your pathology and/or laboratory studies on MyChart before the doctors have had a chance to review them. We understand that in some cases there may be results that are confusing or concerning to you. Please understand that not all results are received at the same time and often the doctors may need to interpret multiple results in order to provide you with the best plan of care or course of treatment. Therefore, we ask that you please give us  2 business days to thoroughly review all your results before contacting the office for clarification. Should we see a critical lab result, you will be contacted sooner.   If You Need Anything After Your Visit  If you have any questions or concerns for your doctor, please call our main line at [phone] and press option 4 to reach your doctor's medical assistant. If no one answers, please leave a voicemail as directed and we will return your call as soon as possible. Messages left after 4 pm will be answered the following business day.   You may also send us  a message via MyChart. We typically respond to MyChart messages within 1-2 business days.  For prescription refills, please ask your pharmacy to contact our office. Our fax number is [phone].  If you have an urgent issue  when the clinic is closed that cannot wait until the next business day, you can page your doctor at the number below.    Please note that while we do our best to be available for urgent issues outside of office hours, we are not available 24/7.   If you have an urgent issue and are unable to reach us , you may choose to seek medical care at your doctor's office, retail clinic, urgent care center, or emergency room.  If you have a medical emergency, please immediately call 911 or go to the emergency department.  Pager Numbers  - Dr. Bary Likes: [phone]  - Dr. Annette Barters: [phone]  - Dr. Felipe Horton: [phone]   In the event of inclement weather, please call our main line at [phone] for an update on the status of any delays or closures.  Dermatology Medication Tips: Please keep the boxes that topical medications come in in order to help keep track of the instructions about where and how to use these. Pharmacies typically print the medication instructions only on the boxes and not directly on the medication tubes.   If your medication is too expensive, please contact our office at [phone] option 4 or send us  a message through MyChart.   We are unable to tell what your co-pay for medications will be in advance as this is different depending on your insurance coverage. However, we may be able to find a substitute medication at lower cost or fill out paperwork to get insurance to cover a needed medication.   If a prior authorization  is required to get your medication covered by your insurance company, please allow us  1-2 business days to complete this process.  Drug prices often vary depending on where the prescription is filled and some pharmacies may offer cheaper prices.  The website www.goodrx.com contains coupons for medications through different pharmacies. The prices here do not account for what the cost may be with help from insurance (it may be cheaper with your insurance),  but the website can give you the price if you did not use any insurance.  - You can print the associated coupon and take it with your prescription to the pharmacy.  - You may also stop by our office during regular business hours and pick up a GoodRx coupon card.  - If you need your prescription sent electronically to a different pharmacy, notify our office through [facility] or by phone at [phone] option 4.     Si Usted Necesita Algo Despus de Su Visita  Tambin puede enviarnos un mensaje a travs de Clinical cytogeneticist. Por lo general respondemos a los mensajes de MyChart en el transcurso de 1 a 2 das hbiles.  Para renovar recetas, por favor pida a su farmacia que se ponga en contacto con nuestra oficina. Franz Jacks de fax es Peggy Landry [phone].  Si tiene un asunto urgente cuando la clnica est cerrada y que no puede esperar hasta el siguiente da hbil, puede llamar/localizar a su doctor(a) al nmero que aparece a continuacin.   Por favor, tenga en cuenta que aunque hacemos todo lo posible para estar disponibles para asuntos urgentes fuera del horario de [employer], no estamos disponibles las 24 horas del da, los 7 [address]  [address] de la [place].   Si tiene un problema urgente y no puede comunicarse con nosotros, puede optar por buscar atencin mdica  en el consultorio de su doctor(a), en una clnica privada, en un centro de atencin urgente o en una sala de emergencias.  Si tiene Engineer, drilling, por favor llame inmediatamente al 911 o vaya a la sala de emergencias.  Nmeros de bper  - Dr. Bary Likes: [phone]  - Dra. Annette Barters: [phone]  - Dr. Felipe Horton: [phone]   En caso de inclemencias del tiempo, por favor llame a Lajuan Pila principal al [phone] para una actualizacin sobre el [place] de cualquier retraso o cierre.  Consejos para la medicacin en dermatologa: Por favor, guarde las cajas en las que vienen los medicamentos de uso tpico para ayudarle a seguir las  instrucciones sobre dnde y cmo usarlos. Las farmacias generalmente imprimen las instrucciones del medicamento slo en las cajas y no directamente en los tubos del [place].   Si su medicamento es muy caro, por favor, pngase en contacto con Peggy Landry llamando al [phone] y presione la opcin 4 o envenos un mensaje a travs de Clinical cytogeneticist.   No podemos decirle cul ser su copago por los medicamentos por adelantado ya que esto es diferente dependiendo de la cobertura de su seguro. Sin embargo, es posible que podamos encontrar un medicamento sustituto a Audiological scientist un formulario para que el seguro cubra el medicamento que se considera necesario.   Si se requiere una autorizacin previa para que su compaa de seguros [place] su medicamento, por favor permtanos de 1 a 2 das hbiles para completar este proceso.  Los precios de los medicamentos varan con frecuencia dependiendo del Environmental consultant de dnde se surte la receta y alguna farmacias pueden ofrecer precios ms baratos.  El sitio web www.goodrx.com tiene cupones para  medicamentos de [employer]. Los precios aqu no tienen en cuenta lo que podra costar con la ayuda del seguro (puede ser ms barato con su seguro), pero el sitio web puede darle el precio si no utiliz Tourist information centre manager.  - Puede imprimir el cupn correspondiente y llevarlo con su receta a la farmacia.  - Tambin puede pasar por nuestra oficina durante el horario de atencin regular y Education officer, museum una tarjeta de cupones de GoodRx.  - Si necesita que su receta se enve electrnicamente a una farmacia diferente, informe a nuestra oficina a travs de MyChart de [place] o por telfono llamando al [phone] y presione la opcin 4.

## 2024-01-12 ENCOUNTER — Encounter (HOSPITAL_COMMUNITY): Payer: Self-pay | Admitting: Obstetrics

## 2024-01-12 NOTE — Progress Notes (Signed)
 Spoke w/ via phone for pre-op interview--- Peggy Landry needs dos----  UPT  per surgeon.      Lab results------ COVID test -----patient states asymptomatic no test needed Arrive at ------- 0930 NPO after MN NO Solid Food.  Clear liquids from MN until---0830 Pre-Surgery Ensure or G2:  Med rec completed Medications to take morning of surgery -----NONE Diabetic medication -----  GLP1 agonist last dose: GLP1 instructions:  Patient instructed no nail polish to be worn day of surgery Patient instructed to bring photo id and insurance card day of surgery Patient aware to have Driver (ride ) / caregiver    for 24 hours after surgery - Mother Peggy Landry Patient Special Instructions ----- Shower with antibacterial soap. Pre-Op special Instructions -----  Patient verbalized understanding of instructions that were given at this phone interview. Patient denies chest pain, sob, fever, cough at the interview.

## 2024-01-24 ENCOUNTER — Other Ambulatory Visit: Payer: Self-pay

## 2024-01-24 ENCOUNTER — Ambulatory Visit (HOSPITAL_COMMUNITY)
Admission: RE | Admit: 2024-01-24 | Discharge: 2024-01-24 | Disposition: A | Discharge status: Home / Self Care | Attending: Obstetrics | Admitting: Obstetrics

## 2024-01-24 ENCOUNTER — Encounter (HOSPITAL_COMMUNITY)
Admission: RE | Disposition: A | Discharge status: Home / Self Care | Payer: Self-pay | Source: Home / Self Care | Attending: Obstetrics

## 2024-01-24 ENCOUNTER — Encounter: Payer: Self-pay | Admitting: Obstetrics

## 2024-01-24 ENCOUNTER — Ambulatory Visit (HOSPITAL_COMMUNITY)

## 2024-01-24 ENCOUNTER — Encounter (HOSPITAL_COMMUNITY): Payer: Self-pay | Admitting: Obstetrics

## 2024-01-24 DIAGNOSIS — F419 Anxiety disorder, unspecified: Secondary | ICD-10-CM | POA: Diagnosis not present

## 2024-01-24 DIAGNOSIS — J45909 Unspecified asthma, uncomplicated: Secondary | ICD-10-CM | POA: Diagnosis not present

## 2024-01-24 DIAGNOSIS — R3915 Urgency of urination: Secondary | ICD-10-CM

## 2024-01-24 DIAGNOSIS — Z01818 Encounter for other preprocedural examination: Secondary | ICD-10-CM

## 2024-01-24 DIAGNOSIS — F17210 Nicotine dependence, cigarettes, uncomplicated: Secondary | ICD-10-CM | POA: Insufficient documentation

## 2024-01-24 DIAGNOSIS — N342 Other urethritis: Secondary | ICD-10-CM

## 2024-01-24 DIAGNOSIS — N368 Other specified disorders of urethra: Secondary | ICD-10-CM

## 2024-01-24 DIAGNOSIS — N340 Urethral abscess: Secondary | ICD-10-CM

## 2024-01-24 DIAGNOSIS — Z79899 Other long term (current) drug therapy: Secondary | ICD-10-CM | POA: Diagnosis not present

## 2024-01-24 DIAGNOSIS — F418 Other specified anxiety disorders: Secondary | ICD-10-CM | POA: Diagnosis not present

## 2024-01-24 DIAGNOSIS — Z791 Long term (current) use of non-steroidal anti-inflammatories (NSAID): Secondary | ICD-10-CM | POA: Insufficient documentation

## 2024-01-24 DIAGNOSIS — F319 Bipolar disorder, unspecified: Secondary | ICD-10-CM | POA: Insufficient documentation

## 2024-01-24 DIAGNOSIS — Z7952 Long term (current) use of systemic steroids: Secondary | ICD-10-CM | POA: Diagnosis not present

## 2024-01-24 HISTORY — PX: EXAM UNDER ANESTHESIA, PELVIC: SHX7461

## 2024-01-24 HISTORY — DX: Unspecified asthma, uncomplicated: J45.909

## 2024-01-24 HISTORY — PX: URETHRAL CYST REMOVAL: SHX5128

## 2024-01-24 HISTORY — PX: CYSTOSCOPY: SHX5120

## 2024-01-24 LAB — POCT PREGNANCY, URINE: Preg Test, Ur: NEGATIVE

## 2024-01-24 SURGERY — EXCISION, CYST, PERIURETHRAL
Anesthesia: Monitor Anesthesia Care

## 2024-01-24 MED ORDER — PROPOFOL 10 MG/ML IV BOLUS
INTRAVENOUS | Status: AC
  Filled 2024-01-24: qty 20

## 2024-01-24 MED ORDER — CHLORHEXIDINE GLUCONATE 0.12 % MT SOLN
OROMUCOSAL | Status: AC
  Filled 2024-01-24: qty 15

## 2024-01-24 MED ORDER — KETOROLAC TROMETHAMINE 30 MG/ML IJ SOLN
INTRAMUSCULAR | Status: AC
  Filled 2024-01-24: qty 1

## 2024-01-24 MED ORDER — SODIUM CHLORIDE (PF) 0.9 % IJ SOLN
INTRAMUSCULAR | Status: AC
  Filled 2024-01-24: qty 10

## 2024-01-24 MED ORDER — KETOROLAC TROMETHAMINE 30 MG/ML IJ SOLN
INTRAMUSCULAR | Status: DC | PRN
  Administered 2024-01-24: 30 mg via INTRAVENOUS

## 2024-01-24 MED ORDER — SODIUM CHLORIDE 0.9 % IV SOLN
2.0000 g | INTRAVENOUS | Status: AC
  Administered 2024-01-24: 2 g via INTRAVENOUS

## 2024-01-24 MED ORDER — ACETAMINOPHEN 500 MG PO TABS
ORAL_TABLET | ORAL | Status: AC
  Filled 2024-01-24: qty 2

## 2024-01-24 MED ORDER — PHENYLEPHRINE 80 MCG/ML (10ML) SYRINGE FOR IV PUSH (FOR BLOOD PRESSURE SUPPORT)
PREFILLED_SYRINGE | INTRAVENOUS | Status: AC
  Filled 2024-01-24: qty 10

## 2024-01-24 MED ORDER — ENOXAPARIN SODIUM 40 MG/0.4ML IJ SOSY
40.0000 mg | PREFILLED_SYRINGE | INTRAMUSCULAR | Status: AC
  Administered 2024-01-24: 40 mg via SUBCUTANEOUS

## 2024-01-24 MED ORDER — FENTANYL CITRATE (PF) 100 MCG/2ML IJ SOLN
INTRAMUSCULAR | Status: AC
Start: 2024-01-24 — End: 2024-01-24
  Filled 2024-01-24: qty 2

## 2024-01-24 MED ORDER — 0.9 % SODIUM CHLORIDE (POUR BTL) OPTIME
TOPICAL | Status: DC | PRN
  Administered 2024-01-24: 1000 mL

## 2024-01-24 MED ORDER — DEXAMETHASONE SODIUM PHOSPHATE 10 MG/ML IJ SOLN
INTRAMUSCULAR | Status: AC
Start: 2024-01-24 — End: 2024-01-24
  Filled 2024-01-24: qty 1

## 2024-01-24 MED ORDER — SODIUM CHLORIDE 0.9 % IV SOLN
INTRAVENOUS | Status: AC
  Filled 2024-01-24: qty 2

## 2024-01-24 MED ORDER — ORAL CARE MOUTH RINSE: 15.0000 mL | Freq: Once | OROMUCOSAL | Status: AC

## 2024-01-24 MED ORDER — ONDANSETRON HCL 4 MG/2ML IJ SOLN
INTRAMUSCULAR | Status: AC
  Filled 2024-01-24: qty 2

## 2024-01-24 MED ORDER — LACTATED RINGERS IV SOLN: INTRAVENOUS | Status: DC

## 2024-01-24 MED ORDER — PHENAZOPYRIDINE HCL 100 MG PO TABS
200.0000 mg | ORAL_TABLET | ORAL | Status: AC
  Administered 2024-01-24: 200 mg via ORAL

## 2024-01-24 MED ORDER — LIDOCAINE-EPINEPHRINE 1 %-1:100000 IJ SOLN
INTRAMUSCULAR | Status: DC | PRN
  Administered 2024-01-24: 6 mL

## 2024-01-24 MED ORDER — ENOXAPARIN SODIUM 40 MG/0.4ML IJ SOSY
PREFILLED_SYRINGE | INTRAMUSCULAR | Status: DC
Start: 2024-01-24 — End: 2024-01-24
  Filled 2024-01-24: qty 0.4

## 2024-01-24 MED ORDER — DEXAMETHASONE SODIUM PHOSPHATE 10 MG/ML IJ SOLN
INTRAMUSCULAR | Status: DC | PRN
  Administered 2024-01-24: 10 mg via INTRAVENOUS

## 2024-01-24 MED ORDER — SODIUM CHLORIDE 0.9 % IR SOLN
Status: DC | PRN
  Administered 2024-01-24: 1000 mL

## 2024-01-24 MED ORDER — FENTANYL CITRATE (PF) 100 MCG/2ML IJ SOLN
INTRAMUSCULAR | Status: DC | PRN
  Administered 2024-01-24 (×2): 50 ug via INTRAVENOUS

## 2024-01-24 MED ORDER — FENTANYL CITRATE (PF) 100 MCG/2ML IJ SOLN: 25.0000 ug | INTRAMUSCULAR | Status: DC | PRN

## 2024-01-24 MED ORDER — PHENAZOPYRIDINE HCL 100 MG PO TABS
ORAL_TABLET | ORAL | Status: AC
  Filled 2024-01-24: qty 2

## 2024-01-24 MED ORDER — ACETAMINOPHEN 500 MG PO TABS
1000.0000 mg | ORAL_TABLET | ORAL | Status: AC
  Administered 2024-01-24: 1000 mg via ORAL

## 2024-01-24 MED ORDER — LIDOCAINE 2% (20 MG/ML) 5 ML SYRINGE
INTRAMUSCULAR | Status: DC | PRN
Start: 2024-01-24 — End: 2024-01-24
  Administered 2024-01-24: 100 mg via INTRAVENOUS

## 2024-01-24 MED ORDER — MIDAZOLAM HCL 2 MG/2ML IJ SOLN
INTRAMUSCULAR | Status: AC
  Filled 2024-01-24: qty 2

## 2024-01-24 MED ORDER — CHLORHEXIDINE GLUCONATE 0.12 % MT SOLN
15.0000 mL | Freq: Once | OROMUCOSAL | Status: AC
  Administered 2024-01-24: 15 mL via OROMUCOSAL

## 2024-01-24 MED ORDER — ONDANSETRON HCL 4 MG/2ML IJ SOLN
INTRAMUSCULAR | Status: DC | PRN
Start: 2024-01-24 — End: 2024-01-24
  Administered 2024-01-24: 4 mg via INTRAVENOUS

## 2024-01-24 MED ORDER — PROPOFOL 10 MG/ML IV BOLUS
INTRAVENOUS | Status: DC | PRN
  Administered 2024-01-24: 150 ug/kg/min via INTRAVENOUS
  Administered 2024-01-24: 100 mg via INTRAVENOUS

## 2024-01-24 MED ORDER — LIDOCAINE 2% (20 MG/ML) 5 ML SYRINGE
INTRAMUSCULAR | Status: AC
  Filled 2024-01-24: qty 5

## 2024-01-24 MED ORDER — MIDAZOLAM HCL 2 MG/2ML IJ SOLN
INTRAMUSCULAR | Status: DC | PRN
  Administered 2024-01-24: 2 mg via INTRAVENOUS

## 2024-01-24 SURGICAL SUPPLY — 31 items
BAG URO CATCHER STRL LF (MISCELLANEOUS) ×2 IMPLANT
BLADE SURG 15 STRL LF DISP TIS (BLADE) ×2 IMPLANT
ELECTRODE REM PT RTRN 9FT ADLT (ELECTROSURGICAL) IMPLANT
GLOVE BIOGEL PI IND STRL 6 (GLOVE) ×2 IMPLANT
GLOVE BIOGEL PI IND STRL 7.5 (GLOVE) IMPLANT
GLOVE BIOGEL PI MICRO STRL 7 (GLOVE) IMPLANT
GLOVE ECLIPSE 6.0 STRL STRAW (GLOVE) ×2 IMPLANT
GLOVE SS PI 5.5 STRL (GLOVE) ×2 IMPLANT
GOWN STRL REUS W/ TWL LRG LVL3 (GOWN DISPOSABLE) ×2 IMPLANT
GOWN STRL REUS W/ TWL XL LVL3 (GOWN DISPOSABLE) IMPLANT
GOWN STRL REUS W/TWL LRG LVL3 (GOWN DISPOSABLE) ×2 IMPLANT
HOLDER FOLEY CATH W/STRAP (MISCELLANEOUS) ×2 IMPLANT
IV NS 1000ML BAXH (IV SOLUTION) IMPLANT
KIT TURNOVER KIT B (KITS) ×2 IMPLANT
MANIFOLD NEPTUNE II (INSTRUMENTS) ×2 IMPLANT
NDL HYPO 22X1.5 SAFETY MO (MISCELLANEOUS) ×2 IMPLANT
NEEDLE HYPO 22X1.5 SAFETY MO (MISCELLANEOUS) ×2 IMPLANT
NS IRRIG 1000ML POUR BTL (IV SOLUTION) ×2 IMPLANT
PACK VAGINAL WOMENS (CUSTOM PROCEDURE TRAY) ×2 IMPLANT
PAD OB MATERNITY 11 LF (PERSONAL CARE ITEMS) IMPLANT
RETRACTOR LONE STAR DISPOSABLE (INSTRUMENTS) ×2 IMPLANT
RETRACTOR STAY HOOK 5MM (MISCELLANEOUS) ×2 IMPLANT
SCRUB CHG 4% DYNA-HEX 4OZ (MISCELLANEOUS) ×2 IMPLANT
SET IRRIG Y TYPE TUR BLADDER L (SET/KITS/TRAYS/PACK) IMPLANT
SLEEVE SCD COMPRESS KNEE MED (STOCKING) ×2 IMPLANT
SPIKE FLUID TRANSFER (MISCELLANEOUS) IMPLANT
SUT VIC AB 2-0 SH 18 (SUTURE) ×2 IMPLANT
SUT VIC AB 2-0 SH 27XBRD (SUTURE) IMPLANT
SYR BULB EAR ULCER 3OZ GRN STR (SYRINGE) ×2 IMPLANT
TOWEL OR 17X24 6PK STRL BLUE (TOWEL DISPOSABLE) ×2 IMPLANT
TRAY FOLEY W/BAG SLVR 14FR LF (SET/KITS/TRAYS/PACK) ×2 IMPLANT

## 2024-01-24 NOTE — Anesthesia Postprocedure Evaluation (Signed)
 Anesthesia Post Note  Patient: Peggy Landry  Procedure(s) Performed: EXCISION, CYST, PERIURETHRAL (SKEENE'S GLAND CYST) CYSTOSCOPY EXAM UNDER ANESTHESIA, PELVIC     Patient location during evaluation: PACU Anesthesia Type: MAC Level of consciousness: awake and alert Pain management: pain level controlled Vital Signs Assessment: post-procedure vital signs reviewed and stable Respiratory status: spontaneous breathing, nonlabored ventilation and respiratory function stable Cardiovascular status: stable and blood pressure returned to baseline Postop Assessment: no apparent nausea or vomiting Anesthetic complications: no   No notable events documented.  Last Vitals:  Vitals:   01/24/24 1315 01/24/24 1330  BP: 114/87 128/89  Pulse: 84 63  Resp: 18 17  Temp:    SpO2: 93% 94%    Last Pain:  Vitals:   01/24/24 1330  TempSrc:   PainSc: 0-No pain                 Veryl Abril,W. EDMOND

## 2024-01-24 NOTE — Interval H&P Note (Signed)
 History and Physical Interval Note:  01/24/2024 11:33 AM  Peggy Landry Comes  has presented today for surgery, with the diagnosis of skene's gland cyst.  The various methods of treatment have been discussed with the patient and family. After consideration of risks, benefits and other options for treatment, the patient has consented to  Procedure(s) with comments: EXCISION, CYST, PERIURETHRAL (N/A) - skene's gland cyst excision; total time needed is CYSTOSCOPY (N/A) EXAM UNDER ANESTHESIA, PELVIC as a surgical intervention.  The patient's history has been reviewed, patient examined, no change in status, stable for surgery.  I have reviewed the patient's chart and labs.  Questions were answered to the patient's satisfaction.     [name] ONEIDA Gillis

## 2024-01-24 NOTE — Transfer of Care (Signed)
 Immediate Anesthesia Transfer of Care Note  Patient: Peggy Landry  Procedure(s) Performed: EXCISION, CYST, PERIURETHRAL (SKEENE'S GLAND CYST) CYSTOSCOPY EXAM UNDER ANESTHESIA, PELVIC  Patient Location: PACU  Anesthesia Type:MAC  Level of Consciousness: drowsy  Airway & Oxygen Therapy: Patient Spontanous Breathing and Patient connected to face mask oxygen  Post-op Assessment: Report given to RN and Post -op Vital signs reviewed and stable  Post vital signs: Reviewed and stable  Last Vitals:  Vitals Value Taken Time  BP 97/60 01/24/24 12:49  Temp    Pulse 79 01/24/24 12:51  Resp 16 01/24/24 12:51  SpO2 97 % 01/24/24 12:51  Vitals shown include unfiled device data.  Last Pain:  Vitals:   01/24/24 0944  TempSrc: Oral  PainSc: 0-No pain      Patients Stated Pain Goal: 7 (01/24/24 0944)  Complications: No notable events documented.

## 2024-01-24 NOTE — Discharge Instructions (Signed)

## 2024-01-24 NOTE — Op Note (Signed)
 Operative Note  Preoperative Diagnosis: Skene gland cyst, urinary urgency  Postoperative Diagnosis: Skene gland cyst, urinary urgency  Procedures performed:   Exam under anesthesia, Skeene's gland cyst excision, and cystoscopy   Implants: * No implants in log *  Attending Surgeon: Lianne Leila Gillis, MD  Assistant Surgeon: n/a  Assistant: Jorene Moats, PA  Anesthesia: MAC  Findings: 1. On vaginal exam, approximately 1.5cm midline yellow fluid filled cyst inferior to urethral meatus present. No erythema or purulent discharge expressed with palpation of cyst  2. On cystoscopy, normal bladder and urethral mucosa without injury or lesion. Brisk bilateral ureteral efflux present and no abnormalities noted with urethroscopy. No urethral injury or foreign material noted after cyst excision.   Specimens:  ID Type Source Tests Collected by Time Destination  1 : skeene's gland cyst Tissue PATH Other SURGICAL PATHOLOGY Gillis Lianne T, MD 01/24/2024 1231     Estimated blood loss: 5 mL  IV fluids: 900 mL  Urine output: 150 mL  Complications: None  Procedure in Detail: After informed consent was obtained the patient was taken to the operating room where monitored anesthesia care was initiated and found to be adequate.  She was placed in dorsolithotomy position taking care to avoid any nerve injury and prepped and draped in the usual sterile fashion.  Cystourethroscopy was performed. An ostia was not identified in the urethra. The bladder mucosa appeared normal and brisk bilateral ureteral efflux was noted.  The cystoscope was removed. Foley catheter was placed and a [place] retractor was placed.  An approximately 1.5cm yellow fluid filled midline suburethral cyst was noted. A mildine incision was made with a 15 blade scalpel over the cyst wall in the suburethral space after the vaginal wall and periurethral region was injected with 1% lidocaine  with epinephrine .  Dissection was performed with Metzenbaum  scissors to gently dissect the cyst wall off of the vesicovaginal fascia with care not to enter the urethra.  Allis clamps were used to place tension on the vaginal epithelium and it was dissected carefully along the interior aspect of the urethra. Yellow/purulent discharge was expressed from the cyst. The entire cyst was removed and sent to pathology. Plication of periurethral tissue was performed using 2-0 vicryl sutures for hemostasis in a figure of eight fashion in 3 layers.  The foley catheter was removed.  Cystourethroscopy was was then performed with 70 degree cystoscope and a 360 degree view of the bladder was obtained and there was no injury noted in the bladder. Brisk bilateral urethral efflux was present.  The cystoscope was slowly removed and the urethra was noted to be watertight without injury or foreign material. Copious irrigation was performed.  The vaginal epithelium was trimmed then dead space with closed with 2-0 vicryl suture in a continuous running fashion. The patient tolerated the procedure well and she was awoken and taken to the recovery room in stable condition.  Needle and sponge count was correct x 2.

## 2024-01-24 NOTE — Anesthesia Preprocedure Evaluation (Addendum)
 Anesthesia Evaluation  Patient identified by MRN, date of birth, ID band Patient awake    Reviewed: Allergy & Precautions, H&P , NPO status , Patient's Chart, lab work & pertinent test results  Airway Mallampati: II  TM Distance: >3 FB Neck ROM: Full    Dental no notable dental hx. (+) Teeth Intact, Dental Advisory Given   Pulmonary asthma , Current Smoker   Pulmonary exam normal breath sounds clear to auscultation       Cardiovascular negative cardio ROS  Rhythm:Regular Rate:Normal     Neuro/Psych   Anxiety Depression Bipolar Disorder   negative neurological ROS     GI/Hepatic negative GI ROS, Neg liver ROS,,,  Endo/Other  negative endocrine ROS    Renal/GU negative Renal ROS  negative genitourinary   Musculoskeletal   Abdominal   Peds  Hematology negative hematology ROS (+)   Anesthesia Other Findings   Reproductive/Obstetrics negative OB ROS                              Anesthesia Physical Anesthesia Plan  ASA: 2  Anesthesia Plan: MAC   Post-op Pain Management: Tylenol  PO (pre-op)*   Induction: Intravenous  PONV Risk Score and Plan: 2 and Ondansetron , Propofol  infusion and Midazolam   Airway Management Planned: Natural Airway and Simple Face Mask  Additional Equipment:   Intra-op Plan:   Post-operative Plan:   Informed Consent: I have reviewed the patients History and Physical, chart, labs and discussed the procedure including the risks, benefits and alternatives for the proposed anesthesia with the patient or authorized representative who has indicated his/her understanding and acceptance.     Dental advisory given  Plan Discussed with: CRNA  Anesthesia Plan Comments:          Anesthesia Quick Evaluation

## 2024-01-25 ENCOUNTER — Encounter (HOSPITAL_COMMUNITY): Payer: Self-pay | Admitting: Obstetrics

## 2024-01-25 LAB — SURGICAL PATHOLOGY

## 2024-01-26 ENCOUNTER — Ambulatory Visit: Payer: Self-pay | Admitting: Obstetrics

## 2024-02-07 ENCOUNTER — Ambulatory Visit (INDEPENDENT_AMBULATORY_CARE_PROVIDER_SITE_OTHER): Admitting: Obstetrics

## 2024-02-07 ENCOUNTER — Encounter: Payer: Self-pay | Admitting: Obstetrics

## 2024-02-07 ENCOUNTER — Other Ambulatory Visit (HOSPITAL_COMMUNITY)
Admission: RE | Admit: 2024-02-07 | Discharge: 2024-02-07 | Disposition: A | Discharge status: Home / Self Care | Source: Ambulatory Visit | Attending: Obstetrics | Admitting: Obstetrics

## 2024-02-07 VITALS — BP 116/80 | HR 87

## 2024-02-07 DIAGNOSIS — N898 Other specified noninflammatory disorders of vagina: Secondary | ICD-10-CM | POA: Insufficient documentation

## 2024-02-07 DIAGNOSIS — N939 Abnormal uterine and vaginal bleeding, unspecified: Secondary | ICD-10-CM | POA: Insufficient documentation

## 2024-02-07 NOTE — Patient Instructions (Addendum)
 We have sent a referral to gynecology for you to establish care regarding irregular cycles.   Please repeat your urine pregnancy test in 3 weeks.   We will contact you regarding your vaginal testing results.

## 2024-02-07 NOTE — Progress Notes (Signed)
 [facility]  Date of Visit: 02/07/2024  History of Present Illness: [name] is a 38 y.o. female scheduled today for a post-operative visit.   Surgery: s/p Exam under anesthesia, Skeene's gland cyst excision, and cystoscopy on 01/24/24  She passed her postoperative void trial.   Postoperative course was uncomplicated.   Today she reports burning with urination subsided since surgery Resumed BV cream.  Denies UTI symptoms Reports unprotected intercourse 3 weeks ago and concerns with irregular cycles since prior to surgery with LMP around 10/2023. UPT negative 01/24/24, 12/16/23, and 09/16/23  UTI in the last 6 weeks? No  Pain? No  She has returned to her normal activity (except for postop restrictions) Vaginal bulge? No  Stress incontinence: No  Urgency/frequency: No  Urge incontinence: No  Voiding dysfunction: No  Bowel issues: constipation managed with miralax   Subjective Success: Do you usually have a bulge or something falling out that you can see or feel in the vaginal area? No  Retreatment Success: Any retreatment with surgery or pessary for any compartment? No   Pathology results: A. SKENE'S GLAND CYST, EXCISION:  - Consistent with clinically stated Skene's gland cyst   Medications: She has a current medication list which includes the following prescription(s): acetaminophen , albuterol , bupropion, clindamycin , qbrexza , hydroxyzine, ibuprofen , OVER THE COUNTER MEDICATION, polyethylene glycol powder, probiotic product, and triamcinolone  cream.   Allergies: Patient has no known allergies.   Physical Exam: BP 116/80   Pulse 87   Abdomen: soft, non-tender, without masses or organomegaly Pelvic Examination: Vagina: suburethral Incisions healing well. Purple suture present at right apex of incision line and there is not granulation tissue. No tenderness along the anterior or posterior vagina. No apical tenderness. No pelvic masses. Blood tinged thick white vaginal  discharge noted.  Pt declined removal of vaginal suture ---------------------------------------------------------  Assessment and Plan:  1. Abnormal uterine bleeding (AUB)   2. Vaginal discharge    Abnormal uterine bleeding (AUB) Assessment & Plan: - irregular cycles since 10/2023, spotting noted on exam today - UPT negative 01/24/24, 12/16/23, and 09/16/23 - unprotected intercourse 3 weeks ago, advised to repeat UPT in 3 weeks - pt requested referral to GYN, GCG referral placed  - encouraged pt to consider resuming birth control since onset of vaginal symptoms in 2022, previously amenorrheic prior to 2022 on Depo-provera .  Orders: -     Ambulatory referral to Gynecology  Vaginal discharge Assessment & Plan: - history of recurrent bacterial vaginosis and yeast infection - past treatment: suppresion flagyl  gel, boric acid, vaginal peroxide, vaginal garlic, vinegar baths - discussed importance to continue tobacco cessation - discontinue metrogel  and switch to PV clindamycin  5g nightly for 7 days, increase from 3x/wk - 06/15/23 Nuswab + BV  - consider treatment of DIV  - consider resuming birth control since onset of symptoms in 2022, previously amenorrheic prior to 2022 on Depo-provera . - encouraged to continue proper vulvar care, warm compression, avoid pad use, cotton only underwear - encouraged probiotics  - pending repeat Nuswab  Orders: -     Cervicovaginal ancillary only  - Pathology results were reviewed with the patient today and she verbalized understanding that the results were benign.  - Can resume regular activity including exercise and intercourse,  if desired.  - Discussed avoidance of heavy lifting and straining long term to reduce the risk of recurrence, discussed bilateral skene's glands.  All questions answered.   Return in about 3 months (around 05/09/2024).

## 2024-02-07 NOTE — Assessment & Plan Note (Addendum)
-   history of recurrent bacterial vaginosis and yeast infection - past treatment: suppresion flagyl  gel, boric acid, vaginal peroxide, vaginal garlic, vinegar baths - discussed importance to continue tobacco cessation - discontinue metrogel  and switch to PV clindamycin  5g nightly for 7 days, increase from 3x/wk - 06/15/23 Nuswab + BV  - consider treatment of DIV  - consider resuming birth control since onset of symptoms in 2022, previously amenorrheic prior to 2022 on Depo-provera . - encouraged to continue proper vulvar care, warm compression, avoid pad use, cotton only underwear - encouraged probiotics  - pending repeat Nuswab

## 2024-02-07 NOTE — Assessment & Plan Note (Addendum)
-   irregular cycles since 10/2023, spotting noted on exam today - UPT negative 01/24/24, 12/16/23, and 09/16/23 - unprotected intercourse 3 weeks ago, advised to repeat UPT in 3 weeks - pt requested referral to GYN, GCG referral placed  - encouraged pt to consider resuming birth control since onset of vaginal symptoms in 2022, previously amenorrheic prior to 2022 on Depo-provera .

## 2024-02-08 ENCOUNTER — Ambulatory Visit: Payer: Self-pay | Admitting: Obstetrics

## 2024-02-08 DIAGNOSIS — B9689 Other specified bacterial agents as the cause of diseases classified elsewhere: Secondary | ICD-10-CM

## 2024-02-08 LAB — CERVICOVAGINAL ANCILLARY ONLY
Bacterial Vaginitis (gardnerella): POSITIVE — AB
Candida Glabrata: NEGATIVE
Candida Vaginitis: NEGATIVE
Comment: NEGATIVE
Comment: NEGATIVE
Comment: NEGATIVE

## 2024-02-08 MED ORDER — METRONIDAZOLE 500 MG PO TABS: 500.0000 mg | ORAL_TABLET | Freq: Two times a day (BID) | ORAL | 0 refills | Status: AC

## 2024-02-21 ENCOUNTER — Encounter: Admitting: Obstetrics

## 2024-03-01 ENCOUNTER — Ambulatory Visit: Payer: Self-pay | Admitting: Obstetrics and Gynecology

## 2024-03-05 ENCOUNTER — Other Ambulatory Visit: Payer: Self-pay

## 2024-03-05 ENCOUNTER — Emergency Department
Admission: EM | Admit: 2024-03-05 | Discharge: 2024-03-05 | Disposition: A | Discharge status: Home / Self Care | Attending: Emergency Medicine | Admitting: Emergency Medicine

## 2024-03-05 DIAGNOSIS — N12 Tubulo-interstitial nephritis, not specified as acute or chronic: Secondary | ICD-10-CM | POA: Insufficient documentation

## 2024-03-05 DIAGNOSIS — R300 Dysuria: Secondary | ICD-10-CM | POA: Diagnosis present

## 2024-03-05 HISTORY — DX: Urinary tract infection, site not specified: N39.0

## 2024-03-05 HISTORY — DX: Disorder of kidney and ureter, unspecified: N28.9

## 2024-03-05 LAB — COMPREHENSIVE METABOLIC PANEL WITH GFR
ALT: 14 U/L (ref 0–44)
AST: 19 U/L (ref 15–41)
Albumin: 3.7 g/dL (ref 3.5–5.0)
Alkaline Phosphatase: 37 U/L — ABNORMAL LOW (ref 38–126)
Anion gap: 10 (ref 5–15)
BUN: 10 mg/dL (ref 6–20)
CO2: 25 mmol/L (ref 22–32)
Calcium: 8.8 mg/dL — ABNORMAL LOW (ref 8.9–10.3)
Chloride: 103 mmol/L (ref 98–111)
Creatinine, Ser: 0.93 mg/dL (ref 0.44–1.00)
GFR, Estimated: >60 mL/min (ref >60)
Glucose, Bld: 105 mg/dL — ABNORMAL HIGH (ref 70–99)
Potassium: 3.7 mmol/L (ref 3.5–5.1)
Sodium: 138 mmol/L (ref 135–145)
Total Bilirubin: 0.7 mg/dL (ref 0.0–1.2)
Total Protein: 6.6 g/dL (ref 6.5–8.1)

## 2024-03-05 LAB — URINALYSIS, ROUTINE W REFLEX MICROSCOPIC
Bilirubin Urine: NEGATIVE
Glucose, UA: NEGATIVE mg/dL
Hgb urine dipstick: NEGATIVE
Ketones, ur: NEGATIVE mg/dL
Nitrite: POSITIVE — AB
Protein, ur: NEGATIVE mg/dL
Specific Gravity, Urine: 1.018 (ref 1.005–1.030)
WBC, UA: >50 WBC/hpf (ref 0–5)
pH: 6 (ref 5.0–8.0)

## 2024-03-05 LAB — CBC
HCT: 38.5 % (ref 36.0–46.0)
Hemoglobin: 13.3 g/dL (ref 12.0–15.0)
MCH: 32.6 pg (ref 26.0–34.0)
MCHC: 34.5 g/dL (ref 30.0–36.0)
MCV: 94.4 fL (ref 80.0–100.0)
Platelets: 170 K/uL (ref 150–400)
RBC: 4.08 MIL/uL (ref 3.87–5.11)
RDW: 12.6 % (ref 11.5–15.5)
WBC: 7.1 K/uL (ref 4.0–10.5)
nRBC: 0 % (ref 0.0–0.2)

## 2024-03-05 LAB — LIPASE, BLOOD: Lipase: 38 U/L (ref 11–51)

## 2024-03-05 LAB — POC URINE PREG, ED: Preg Test, Ur: NEGATIVE

## 2024-03-05 MED ORDER — CEFTRIAXONE SODIUM 1 G IJ SOLR
1.0000 g | Freq: Once | INTRAMUSCULAR | Status: AC
  Administered 2024-03-05: 1 g via INTRAMUSCULAR
  Filled 2024-03-05: qty 10

## 2024-03-05 MED ORDER — KETOROLAC TROMETHAMINE 30 MG/ML IJ SOLN
30.0000 mg | Freq: Once | INTRAMUSCULAR | Status: AC
  Administered 2024-03-05: 30 mg via INTRAMUSCULAR
  Filled 2024-03-05: qty 1

## 2024-03-05 MED ORDER — ACETAMINOPHEN 500 MG PO TABS
1000.0000 mg | ORAL_TABLET | Freq: Once | ORAL | Status: AC
  Administered 2024-03-05: 1000 mg via ORAL
  Filled 2024-03-05: qty 2

## 2024-03-05 MED ORDER — CEFPODOXIME PROXETIL 200 MG PO TABS: 200.0000 mg | ORAL_TABLET | Freq: Two times a day (BID) | ORAL | 0 refills | Status: DC

## 2024-03-05 MED ORDER — ONDANSETRON 4 MG PO TBDP: 4.0000 mg | ORAL_TABLET | Freq: Three times a day (TID) | ORAL | 0 refills | Status: AC | PRN

## 2024-03-05 NOTE — Discharge Instructions (Signed)
 You were seen in the emergency department for burning with urination and left-sided kidney pain.  You were diagnosed with a urinary tract infection.  Your urine was sent for culture.  You were given your first dose of antibiotic in the emergency department.  You are given a prescription for an antibiotic, you can take your next dose tomorrow morning.  You are given an Zofran  which is a nausea medication you can take as needed for nausea and vomiting.  Stay hydrated drink plenty of fluids.  Follow-up closely with your primary care physician.  You are given information to follow-up with urology given your frequent urinary tract infections.  Pain control:  Ibuprofen  (motrin /aleve/advil ) - You can take 3 tablets (600 mg) every 6 hours as needed for pain/fever.  Acetaminophen  (tylenol ) - You can take 2 extra strength tablets (1000 mg) every 6 hours as needed for pain/fever.  You can alternate these medications or take them together.  Make sure you eat food/drink water when taking these medications.  zofran  (ondansetron ) - nausea medication, take 1 tablet every 8 hours as needed for nausea/vomiting.

## 2024-03-05 NOTE — ED Provider Notes (Signed)
 [facility] Provider Note    Event Date/Time   First MD Initiated Contact with Patient 03/05/24 1436     (approximate)   History   No chief complaint on file.   HPI  Peggy Landry is a 38 y.o. female past history significant for frequent urinary tract infections who presents to the emergency department with dysuria.  Patient states that she has been having some left-sided abdominal pain for the past 3 to 4 days.  Complaining of dysuria with urinary urgency and frequency.  Mild nausea but episodes of vomiting.  No fever.  No history of kidney stone.  Denies concern for pregnancy.     Physical Exam   Triage Vital Signs: ED Triage Vitals  Encounter Vitals Group     BP 03/05/24 1322 117/86     Girls Systolic BP Percentile --      Girls Diastolic BP Percentile --      Boys Systolic BP Percentile --      Boys Diastolic BP Percentile --      Pulse Rate 03/05/24 1322 (!) 103     Resp 03/05/24 1322 16     Temp 03/05/24 1322 99.1 F (37.3 C)     Temp Source 03/05/24 1322 Oral     SpO2 03/05/24 1322 98 %     Weight 03/05/24 1322 160 lb (72.6 kg)     Height 03/05/24 1322 5' 6 (1.676 m)     Head Circumference --      Peak Flow --      Pain Score 03/05/24 1325 7     Pain Loc --      Pain Education --      Exclude from Growth Chart --     Most recent vital signs: Vitals:   03/05/24 1322  BP: 117/86  Pulse: (!) 103  Resp: 16  Temp: 99.1 F (37.3 C)  SpO2: 98%    Physical Exam Constitutional:      Appearance: She is well-developed.  HENT:     Head: Atraumatic.  Eyes:     Conjunctiva/sclera: Conjunctivae normal.  Cardiovascular:     Rate and Rhythm: Regular rhythm.  Pulmonary:     Effort: No respiratory distress.  Abdominal:     General: There is no distension.     Tenderness: There is no abdominal tenderness. There is left CVA tenderness. There is no right CVA tenderness or guarding.  Musculoskeletal:        General: Normal range of  motion.     Cervical back: Normal range of motion.  Skin:    General: Skin is warm.  Neurological:     Mental Status: She is alert. Mental status is at baseline.     IMPRESSION / MDM / ASSESSMENT AND PLAN / ED COURSE  I reviewed the triage vital signs and the nursing notes.  Differential diagnosis including urinary tract infection, pyelonephritis, kidney stone, musculoskeletal  LABS (all labs ordered are listed, but only abnormal results are displayed) Labs interpreted as -    Labs Reviewed  COMPREHENSIVE METABOLIC PANEL WITH GFR - Abnormal; Notable for the following components:      Result Value   Glucose, Bld 105 (*)    Calcium 8.8 (*)    Alkaline Phosphatase 37 (*)    All other components within normal limits  URINALYSIS, ROUTINE W REFLEX MICROSCOPIC - Abnormal; Notable for the following components:   Color, Urine YELLOW (*)    APPearance CLOUDY (*)  Nitrite POSITIVE (*)    Leukocytes,Ua LARGE (*)    Bacteria, UA MANY (*)    All other components within normal limits  URINE CULTURE  LIPASE, BLOOD  CBC  POC URINE PREG, ED     MDM  On arrival afebrile, tachycardic  Given ketorolac  and Tylenol  for pain control  Patient's urine consistent with urinary tract infection with positive nitrites and leukocyte esterase with many bacteria.  On chart review does not have any history of a resistant urinary tract infection.  Does have some blood in the urine.  No history of kidney stone.  No sharp stabbing pain.  Bedside ultrasound with no signs of hydronephrosis.  On reevaluation had significant improvement of her pain.  No nausea or vomiting.  Have a very low suspicion for an infected kidney stone.  Patient given first dose of IV Rocephin .  Urine was sent for culture.  Discussed close follow-up as an outpatient with primary care physician.  Given information to follow-up with urology given her recurrent urinary tract infections she is also followed by urogyn.  Discussed  return for any ongoing or worsening symptoms.  No questions at time of discharge.     PROCEDURES:  Critical Care performed: No  Ultrasound ED Renal  Date/Time: 03/05/2024 8:08 PM  Performed by: Suzanne Kirsch, MD Authorized by: Suzanne Kirsch, MD   Procedure details:    Indications: hydronephrosis     Technique:  L kidney and R kidneyImages: not archivedStudy Limitations: body habitus Left kidney findings:    Mass: not identified     Hydronephrosis: none   Right kidney findings:    Hydronephrosis: none     Patient's presentation is most consistent with acute presentation with potential threat to life or bodily function.   MEDICATIONS ORDERED IN ED: Medications  ketorolac  (TORADOL ) 30 MG/ML injection 30 mg (30 mg Intramuscular Given 03/05/24 1508)  acetaminophen  (TYLENOL ) tablet 1,000 mg (1,000 mg Oral Given 03/05/24 1508)  cefTRIAXone  (ROCEPHIN ) injection 1 g (1 g Intramuscular Given 03/05/24 1508)    FINAL CLINICAL IMPRESSION(S) / ED DIAGNOSES   Final diagnoses:  Pyelonephritis     Rx / DC Orders   ED Discharge Orders          Ordered    cefpodoxime  (VANTIN ) 200 MG tablet  2 times daily        03/05/24 1555    ondansetron  (ZOFRAN -ODT) 4 MG disintegrating tablet  Every 8 hours PRN        03/05/24 1555             Note:  This document was prepared using Dragon voice recognition software and may include unintentional dictation errors.   Suzanne Kirsch, MD 03/05/24 2011

## 2024-03-05 NOTE — ED Triage Notes (Signed)
 Pt to ED for lower back pain since 3 days and lower abdominal pain since yesterday. Denies dysuria. Back pain worse with rotational movements. LMP was 7/11. Had irregular periods this spring.

## 2024-03-07 ENCOUNTER — Telehealth: Payer: Self-pay | Admitting: Emergency Medicine

## 2024-03-07 LAB — URINE CULTURE: Culture: >=100000 — AB

## 2024-03-07 MED ORDER — CEFDINIR 300 MG PO CAPS: 300.0000 mg | ORAL_CAPSULE | Freq: Two times a day (BID) | ORAL | 0 refills | Status: AC

## 2024-03-07 NOTE — Telephone Encounter (Signed)
 Rx resent.

## 2024-03-07 NOTE — Telephone Encounter (Signed)
 Notified by RN that patient had called in and the cefpodoxime  that was prescribed for her pyelonephritis was not covered by her insurance, requesting alternative prescription.  Reviewed visit, presented with urinary symptoms and abdominal pain.  Discontinued cefpodoxime  and sent prescription for 10-day course of cefdinir .

## 2024-04-04 NOTE — Progress Notes (Signed)
 38 y.o. [id number] female here for AUB referral- from UROGYN. Single.  Patient's last menstrual period was 03/08/2024 (approximate). Period Duration (Days): 5 Menstrual Flow: Light Menstrual Control: Maxi pad, Tampon Dysmenorrhea: (!) Mild  She reports has had recurring BV and UTIs. Dx with pyelo- 03/05/24 Has appt with urology for recurrent UTI/pyelo at [facility]. She has been struggling with recurrent BV for ~77yr. She is treating her self with PV ?metrogel  and boric acid weekly. Last used last night. Her partner has never been treated with BV.  Has seen urogyn and was told there was blood in urine. Reports irregular cycles - missed menses at times.  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 No complications and she has fone well.  She has been on Depo on and off for 2 years. Most recently, she was on Depo for 66yr, stopped 2 years ago. Her periods have been irregular since. However, she has had a light period in July and August. She is trying for pregnancy with her current partner. History of severe dysmenorrhea, managed with Depo. Denies irregular periods.  Urine sample provided: yes  Birth control: condoms Last mammogram: n/a Sexually active: yes, boyfriend of 50yr   GYN HISTORY: No sig hx  OB History  Gravida Para Term Preterm AB Living  3 1 1  2 1   SAB IAB Ectopic Multiple Live Births  2    1    # Outcome Date GA Lbr Len/2nd Weight Sex Type Anes PTL Lv  3 Term     M Vag-Spont   LIV  2 SAB           1 SAB            Past Medical History:  Diagnosis Date   Anxiety    Asthma    seasonal   Depression    Herpes genitalis in women    Recurrent UTI    Renal disorder    hx kidney infection one time   Past Surgical History:  Procedure Laterality Date   CYSTOSCOPY N/A 01/24/2024   Procedure: CYSTOSCOPY;  Surgeon: Guadlupe Lianne DASEN, MD;  Location: [facility] OR;  Service: Gynecology;  Laterality: N/A;   EXAM UNDER ANESTHESIA, PELVIC  01/24/2024   Procedure: EXAM UNDER  ANESTHESIA, PELVIC;  Surgeon: Guadlupe Lianne DASEN, MD;  Location: [facility] OR;  Service: Gynecology;;   URETHRAL CYST REMOVAL N/A 01/24/2024   Procedure: EXCISION, CYST, PERIURETHRAL (SKEENE'S GLAND CYST);  Surgeon: Guadlupe Lianne DASEN, MD;  Location: [facility] OR;  Service: Gynecology;  Laterality: N/A;  skene's gland cyst excision; total time needed is   WISDOM TOOTH EXTRACTION     Current Outpatient Medications on File Prior to Visit  Medication Sig Dispense Refill   acetaminophen  (TYLENOL ) 500 MG tablet Take 1 tablet (500 mg total) by mouth every 6 (six) hours as needed (pain). 30 tablet 0   albuterol  (VENTOLIN  HFA) 108 (90 Base) MCG/ACT inhaler Inhale 2 puffs into the lungs every 6 (six) hours as needed for wheezing or shortness of breath. 16 g 1   buPROPion (WELLBUTRIN XL) 300 MG 24 hr tablet Take 300 mg by mouth daily.     clindamycin  (CLEOCIN -T) 1 % lotion Apply topically 2 (two) times daily. 60 mL 11   Flaxseed, Linseed, (FLAXSEED OIL PO) Take by mouth.     hydrOXYzine (VISTARIL) 25 MG capsule Take 25 mg by mouth daily.     ibuprofen  (ADVIL ) 600 MG tablet Take 1 tablet (600 mg total) by mouth  every 6 (six) hours as needed. 30 tablet 0   ondansetron  (ZOFRAN -ODT) 4 MG disintegrating tablet Take 1 tablet (4 mg total) by mouth every 8 (eight) hours as needed for nausea or vomiting. 30 tablet 0   OVER THE COUNTER MEDICATION Take 1 capsule by mouth daily. chlorophyll pills     polyethylene glycol powder (GLYCOLAX /MIRALAX ) 17 GM/SCOOP powder Take 17 g by mouth daily. Drink 17g (1 scoop) dissolved in water per day. 255 g 0   Probiotic Product (PROBIOTIC PO) Take 1 capsule by mouth daily.     triamcinolone  cream (KENALOG ) 0.1 % Apply 1 Application topically 2 (two) times daily as needed (Rash). Until smooth. Avoid applying to face, groin, and axilla. Use as directed. Long-term use can cause thinning of the skin. 80 g 1   No current facility-administered medications on file prior to visit.   No Known Allergies     PE Today's Vitals   04/05/24 1405  BP: 124/76  Pulse: 90  Temp: 98.4 F (36.9 C)  TempSrc: Oral  SpO2: 98%  Weight: 158 lb (71.7 kg)  Height: 5' 6.5 (1.689 m)   Body mass index is 25.12 kg/m.  Physical Exam Vitals reviewed. Exam conducted with a chaperone present.  Constitutional:      General: She is not in acute distress.    Appearance: Normal appearance.  HENT:     Head: Normocephalic and atraumatic.     Nose: Nose normal.  Eyes:     Extraocular Movements: Extraocular movements intact.     Conjunctiva/sclera: Conjunctivae normal.  Pulmonary:     Effort: Pulmonary effort is normal.  Genitourinary:    General: Normal vulva.     Exam position: Lithotomy position.     Vagina: Normal. No vaginal discharge.     Cervix: Normal. No cervical motion tenderness, discharge or lesion.     Uterus: Normal. Not enlarged and not tender.      Adnexa: Right adnexa normal and left adnexa normal.  Musculoskeletal:        General: Normal range of motion.     Cervical back: Normal range of motion.  Neurological:     General: No focal deficit present.     Mental Status: She is alert.  Psychiatric:        Mood and Affect: Mood normal.        Behavior: Behavior normal.      Assessment and Plan:        BV (bacterial vaginosis) -     SureSwab Advanced Vaginitis, TMA Recommend partner treatment by PCP or HD Will f/u once I have results  Abnormal uterine bleeding (AUB) -     Thyroid  Panel With TSH -     FSH/LH -     Prolactin -     Testos,Total,Free and SHBG (Female) -     hCG, quantitative, pregnancy 06/2023 MRI pelvis: retroverted uterus, normal ovaries. Will complete hormonal w/u Discussed AUB is likely due to prolonged effect from Depo Cycles are improving and patient desires conception Continue to track cycles RTO in 3 months for annual or sooner if results are abnormal   Vera LULLA Pa, MD

## 2024-04-05 ENCOUNTER — Ambulatory Visit: Payer: Self-pay | Admitting: Obstetrics and Gynecology

## 2024-04-05 ENCOUNTER — Encounter: Payer: Self-pay | Admitting: Obstetrics and Gynecology

## 2024-04-05 VITALS — BP 124/76 | HR 90 | Temp 98.4°F | Ht 66.5 in | Wt 158.0 lb

## 2024-04-05 DIAGNOSIS — N760 Acute vaginitis: Secondary | ICD-10-CM

## 2024-04-05 DIAGNOSIS — N939 Abnormal uterine and vaginal bleeding, unspecified: Secondary | ICD-10-CM | POA: Diagnosis not present

## 2024-04-05 DIAGNOSIS — B9689 Other specified bacterial agents as the cause of diseases classified elsewhere: Secondary | ICD-10-CM

## 2024-04-06 ENCOUNTER — Ambulatory Visit: Payer: Self-pay | Admitting: Obstetrics and Gynecology

## 2024-04-06 LAB — SURESWAB® ADVANCED VAGINITIS,TMA
CANDIDA SPECIES: NOT DETECTED
Candida glabrata: NOT DETECTED
SURESWAB(R) ADV BACTERIAL VAGINOSIS(BV),TMA: NEGATIVE
TRICHOMONAS VAGINALIS (TV),TMA: NOT DETECTED

## 2024-04-11 LAB — THYROID PANEL WITH TSH
Free Thyroxine Index: 2.4 (ref 1.4–3.8)
T3 Uptake: 28 % (ref 22–35)
T4, Total: 8.4 ug/dL (ref 5.1–11.9)
TSH: 0.52 m[IU]/L

## 2024-04-11 LAB — FSH/LH
FSH: 6.2 m[IU]/mL
LH: 2.1 m[IU]/mL

## 2024-04-11 LAB — TESTOS,TOTAL,FREE AND SHBG (FEMALE)
Free Testosterone: 0.5 pg/mL (ref 0.1–6.4)
Sex Hormone Binding: 69 nmol/L (ref 17–124)
Testosterone, Total, LC-MS-MS: 6 ng/dL (ref 2–45)

## 2024-04-11 LAB — HCG, QUANTITATIVE, PREGNANCY: HCG, Total, QN: <5 m[IU]/mL

## 2024-04-11 LAB — PROLACTIN: Prolactin: 5.6 ng/mL

## 2024-05-10 ENCOUNTER — Ambulatory Visit: Admitting: Obstetrics

## 2024-05-10 NOTE — Progress Notes (Deleted)
 [facility]  Date of Visit: 05/10/2024  History of Present Illness: Peggy Landry is a 38 y.o. female scheduled today for a post-operative visit.   Surgery: s/p Exam under anesthesia, Skeene's gland cyst excision, and cystoscopy on 01/24/24  She passed her postoperative void trial.   Postoperative course was uncomplicated.   Today she reports burning with urination subsided since surgery Resumed BV cream.  Denies UTI symptoms ***pending appt with urology at [facility] for rUTI/pyelonephritis Reports unprotected intercourse 3 weeks ago and concerns with irregular cycles since prior to surgery with LMP around 10/2023. UPT negative 01/24/24, 12/16/23, and 09/16/23  UTI in the last 6 weeks? No  Pain? No  She has returned to her normal activity (except for postop restrictions) Vaginal bulge? No  Stress incontinence: No  Urgency/frequency: No  Urge incontinence: No  Voiding dysfunction: No  Bowel issues: constipation managed with miralax   Subjective Success: Do you usually have a bulge or something falling out that you can see or feel in the vaginal area? No  Retreatment Success: Any retreatment with surgery or pessary for any compartment? No   Pathology results: A. 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   Allergies: Patient has no known allergies.   Physical Exam: There were no vitals taken for this visit.  Abdomen: soft, non-tender, without masses or organomegaly Pelvic Examination: Vagina: suburethral Incisions healing well. Purple suture present at right apex of incision line and there is not granulation tissue. No tenderness along the  anterior or posterior vagina. No apical tenderness. No pelvic masses. Blood tinged thick white vaginal discharge noted.  Pt declined removal of vaginal suture ---------------------------------------------------------  Assessment and Plan:  No diagnosis found.  There are no diagnoses linked to this encounter. - Pathology results were reviewed with the patient today and she verbalized understanding that the results were benign.  - Can resume regular activity including exercise and intercourse,  if desired.  - Discussed avoidance of heavy lifting and straining long term to reduce the risk of recurrence, discussed bilateral skene's glands.  All questions answered.   No follow-ups on file.

## 2024-06-07 ENCOUNTER — Emergency Department

## 2024-06-07 ENCOUNTER — Emergency Department
Admission: EM | Admit: 2024-06-07 | Discharge: 2024-06-07 | Disposition: A | Discharge status: Home / Self Care | Attending: Emergency Medicine | Admitting: Emergency Medicine

## 2024-06-07 ENCOUNTER — Other Ambulatory Visit: Payer: Self-pay

## 2024-06-07 DIAGNOSIS — J45909 Unspecified asthma, uncomplicated: Secondary | ICD-10-CM | POA: Diagnosis not present

## 2024-06-07 DIAGNOSIS — R10A2 Flank pain, left side: Secondary | ICD-10-CM | POA: Diagnosis present

## 2024-06-07 LAB — COMPREHENSIVE METABOLIC PANEL WITH GFR
ALT: 9 U/L (ref 0–44)
AST: 16 U/L (ref 15–41)
Albumin: 4.5 g/dL (ref 3.5–5.0)
Alkaline Phosphatase: 33 U/L — ABNORMAL LOW (ref 38–126)
Anion gap: 10 (ref 5–15)
BUN: 9 mg/dL (ref 6–20)
CO2: 24 mmol/L (ref 22–32)
Calcium: 9.3 mg/dL (ref 8.9–10.3)
Chloride: 100 mmol/L (ref 98–111)
Creatinine, Ser: 0.88 mg/dL (ref 0.44–1.00)
GFR, Estimated: >60 mL/min (ref >60)
Glucose, Bld: 123 mg/dL — ABNORMAL HIGH (ref 70–99)
Potassium: 3.8 mmol/L (ref 3.5–5.1)
Sodium: 134 mmol/L — ABNORMAL LOW (ref 135–145)
Total Bilirubin: 0.2 mg/dL (ref 0.0–1.2)
Total Protein: 6.8 g/dL (ref 6.5–8.1)

## 2024-06-07 LAB — URINALYSIS, ROUTINE W REFLEX MICROSCOPIC
Bilirubin Urine: NEGATIVE
Glucose, UA: NEGATIVE mg/dL
Hgb urine dipstick: NEGATIVE
Ketones, ur: NEGATIVE mg/dL
Leukocytes,Ua: NEGATIVE
Nitrite: NEGATIVE
Protein, ur: NEGATIVE mg/dL
Specific Gravity, Urine: 1.005 (ref 1.005–1.030)
pH: 7 (ref 5.0–8.0)

## 2024-06-07 LAB — CBC
HCT: 40.1 % (ref 36.0–46.0)
Hemoglobin: 13.7 g/dL (ref 12.0–15.0)
MCH: 32 pg (ref 26.0–34.0)
MCHC: 34.2 g/dL (ref 30.0–36.0)
MCV: 93.7 fL (ref 80.0–100.0)
Platelets: 215 K/uL (ref 150–400)
RBC: 4.28 MIL/uL (ref 3.87–5.11)
RDW: 13.2 % (ref 11.5–15.5)
WBC: 7.2 K/uL (ref 4.0–10.5)
nRBC: 0 % (ref 0.0–0.2)

## 2024-06-07 LAB — LIPASE, BLOOD: Lipase: 47 U/L (ref 11–51)

## 2024-06-07 LAB — CHLAMYDIA/NGC RT PCR (ARMC ONLY)
Chlamydia Tr: NOT DETECTED
N gonorrhoeae: NOT DETECTED

## 2024-06-07 LAB — POC URINE PREG, ED: Preg Test, Ur: NEGATIVE

## 2024-06-07 MED ORDER — IBUPROFEN 800 MG PO TABS
800.0000 mg | ORAL_TABLET | Freq: Once | ORAL | Status: AC
  Administered 2024-06-07: 800 mg via ORAL
  Filled 2024-06-07: qty 1

## 2024-06-07 NOTE — Discharge Instructions (Signed)
 Your exam, labs, and CT scan are overall normal and reassuring.  No signs of acute UTI or pyelonephritis.  You have some pending labs which will be resulted within the next hour.  You will be notified by telephone if treatment is required.  You will be notified if your urine culture results indicate need for antibiotics.  Follow-up with your primary provider for ongoing evaluation.

## 2024-06-07 NOTE — ED Triage Notes (Signed)
 Pt sts that she has been having a left side flank pain from a UTI. PT sts that this is a reoccurring problem.

## 2024-06-07 NOTE — ED Provider Notes (Signed)
 [facility] Emergency Department Provider Note     Event Date/Time   First MD Initiated Contact with Patient 06/07/24 1548     (approximate)   History   Flank Pain   HPI  Peggy Landry is a 38 y.o. female with a history of anxiety, depression, asthma, recurrent UTIs, presents to the ED endorsing left-sided flank pain.  Patient reports symptoms similar to her prior UTIs.  She denies any current fevers, chills, sweats, diarrhea, nausea and vomiting, gross hematuria urinary retention, or treatments reported.     Physical Exam   Triage Vital Signs: ED Triage Vitals  Encounter Vitals Group     BP 06/07/24 1251 130/85     Girls Systolic BP Percentile --      Girls Diastolic BP Percentile --      Boys Systolic BP Percentile --      Boys Diastolic BP Percentile --      Pulse Rate 06/07/24 1251 92     Resp 06/07/24 1251 16     Temp 06/07/24 1251 98.7 F (37.1 C)     Temp Source 06/07/24 1251 Oral     SpO2 06/07/24 1251 100 %     Weight 06/07/24 1251 162 lb (73.5 kg)     Height 06/07/24 1251 5' 6 (1.676 m)     Head Circumference --      Peak Flow --      Pain Score 06/07/24 1303 6     Pain Loc --      Pain Education --      Exclude from Growth Chart --     Most recent vital signs: Vitals:   06/07/24 1251 06/07/24 1635  BP: 130/85 123/79  Pulse: 92 79  Resp: 16 17  Temp: 98.7 F (37.1 C) 98.5 F (36.9 C)  SpO2: 100% 100%    General Awake, no distress. NAD HEENT NCAT. PERRL. EOMI. No rhinorrhea. Mucous membranes are moist.  CV:  Good peripheral perfusion.  RESP:  Normal effort.  ABD:  No distention.  Mild left CVA tenderness elicited on exam.  Normal bowel sounds x 4. GYN:  deferred   ED Results / Procedures / Treatments   Labs (all labs ordered are listed, but only abnormal results are displayed) Labs Reviewed  COMPREHENSIVE METABOLIC PANEL WITH GFR - Abnormal; Notable for the following components:      Result Value    Sodium 134 (*)    Glucose, Bld 123 (*)    Alkaline Phosphatase 33 (*)    All other components within normal limits  URINALYSIS, ROUTINE W REFLEX MICROSCOPIC - Abnormal; Notable for the following components:   Color, Urine COLORLESS (*)    APPearance CLEAR (*)    All other components within normal limits  URINE CULTURE  CHLAMYDIA/NGC RT PCR ([facility] ONLY)            LIPASE, BLOOD  CBC  POC URINE PREG, ED     EKG   RADIOLOGY  I personally viewed and evaluated these images as part of my medical decision making, as well as reviewing the written report by the radiologist.  ED Provider Interpretation: No acute findings  CT Renal Stone Study  IMPRESSION: 1. No renal, ureteral or bladder calculi or mass. 2. No acute abdominal/pelvic findings, mass lesions or adenopathy.    PROCEDURES:  Critical Care performed: No  Procedures   MEDICATIONS ORDERED IN ED: Medications  ibuprofen  (ADVIL ) tablet 800 mg (800 mg Oral Given  06/07/24 1804)     IMPRESSION / MDM / ASSESSMENT AND PLAN / ED COURSE  I reviewed the triage vital signs and the nursing notes.                              Differential diagnosis includes, but is not limited to, ovarian cyst, ovarian torsion, acute appendicitis, diverticulitis, urinary tract infection/pyelonephritis, endometriosis, bowel obstruction, colitis, renal colic, gastroenteritis, hernia, fibroids, pregnancy related pain including ectopic pregnancy, etc.  Patient's presentation is most consistent with acute complicated illness / injury requiring diagnostic workup.  Patient's diagnosis is consistent with left flank pain without evidence of acute pyelonephritis or kidney stones.  Patient's labs are reassuring including UA that shows no evidence of bacteriuria.  CT renal stone study interpreted by me, shows no evidence of any intra abdominal pelvic findings to explain the patient's symptoms.  Patient wet prep sent to the lab by me, was canceled as the  specimen did not have saline in the collection tube.  GC shows no evidence of of ureteritis.. Patient will be discharged home with prescriptions for ibuprofen . Patient is to follow up with her PCP or GYN as suggested, as needed or otherwise directed. Patient is given ED precautions to return to the ED for any worsening or new symptoms.     FINAL CLINICAL IMPRESSION(S) / ED DIAGNOSES   Final diagnoses:  Left flank pain     Rx / DC Orders   ED Discharge Orders     None        Note:  This document was prepared using Dragon voice recognition software and may include unintentional dictation errors.    Loyd Candida LULLA Aldona, PA-C 06/10/24 KATHEREN    Suzanne Kirsch, MD 06/12/24 [phone]

## 2024-06-07 NOTE — ED Notes (Signed)
 Pt discharged to home.  AVS reviewed and pt verbalized understanding.  Pt has no questions at this time.

## 2024-06-09 LAB — URINE CULTURE
Culture: NO GROWTH
Special Requests: NORMAL

## 2024-07-05 NOTE — Progress Notes (Signed)
 38 y.o. [id number] female with AUB, tobacco use, recurrent UTI here for annual exam. 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 PCP: Lorel Maxie LABOR, MD   Patient's last menstrual period was 06/22/2024 (exact date). Period Duration (Days): 5 Period Pattern: Regular Menstrual Flow: Light Menstrual Control: Maxi pad Dysmenorrhea: (!) Moderate  She reports vaginal odor - used metro gel but desires antibiotic for recurrent BV. Last used 2d ago.  She reports right abdomen pain for 2d last week, resolved after taking laxative and having BM. No N/V. Using miralax  and herbal supp as needed for constipation. Desires to follow up with [facility] urology for pyelonephritis treated at ED Aug 2025- PCN-resistant E. Coli. 4 E. Coli UTIs over past year. No current urinary symptoms. Desires STI testing, new partner, no longer seeing boyfriend Also notes body odor, using hibiclens .  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    OB History  Gravida Para Term Preterm AB Living  3 1 1  2 1   SAB IAB Ectopic Multiple Live Births  2    1    # Outcome Date GA Lbr Len/2nd Weight Sex Type Anes PTL Lv  3 Term     M Vag-Spont   LIV  2 SAB           1 SAB            Past Medical History:  Diagnosis Date   Anxiety    Asthma    seasonal   Depression    Herpes genitalis in women    Recurrent UTI    Renal disorder    hx kidney infection one time   Past Surgical History:  Procedure Laterality  Date   CYSTOSCOPY N/A 01/24/2024   Procedure: CYSTOSCOPY;  Surgeon: Guadlupe Lianne DASEN, MD;  Location: [facility] OR;  Service: Gynecology;  Laterality: N/A;   EXAM UNDER ANESTHESIA, PELVIC  01/24/2024   Procedure: EXAM UNDER ANESTHESIA, PELVIC;  Surgeon: Guadlupe Lianne DASEN, MD;  Location: [facility] OR;  Service: Gynecology;;   URETHRAL CYST REMOVAL N/A 01/24/2024   Procedure: EXCISION, CYST, PERIURETHRAL (SKEENE'S GLAND CYST);  Surgeon: Guadlupe Lianne DASEN, MD;  Location: [facility] OR;  Service: Gynecology;  Laterality: N/A;  skene's gland cyst excision; total time needed is   WISDOM TOOTH EXTRACTION     Medications Ordered Prior to Encounter[1] Social History   Socioeconomic History   Marital status: Single    Spouse name: Not on file   Number of children: Not on file   Years of education: Not on file   Highest education level: Not on file  Occupational History   Not on file  Tobacco Use   Smoking status: Every Day    Current packs/day: 1.00    Average packs/day: 1 pack/day for 22.3 years (22.3 ttl pk-yrs)    Types: Cigarettes    Start date: 03/05/2002   Smokeless tobacco: Current   Tobacco comments:    Quitline info given and refer to PCP for meds.  Vaping Use   Vaping status: Never Used  Substance and Sexual Activity   Alcohol use: Yes    Alcohol/week: 4.0 standard drinks of alcohol    Types: 3 Cans of beer, 1 Standard drinks or equivalent per week    Comment: Once a week   Drug use: Yes    Types: Marijuana, Cocaine    Comment: last use 07/2020 cocaine/ no marijuana recently as of 01/2024   Sexual activity: Yes    Partners: Male    Birth control/protection: Condom  Other Topics Concern   Not on file  Social History Narrative   Not on file   Social Drivers of Health   Tobacco Use: High Risk (07/06/2024)   Patient History    Smoking Tobacco Use: Every Day    Smokeless Tobacco Use: Current    Passive Exposure: Not on file  Financial Resource Strain: Not on file  Food Insecurity: Not on file  Transportation  Needs: Not on file  Physical Activity: Not on file  Stress: Not on file  Social Connections: Not on file  Intimate Partner Violence: Not on file  Depression (PHQ2-9): Low Risk (07/06/2024)   Depression (PHQ2-9)    PHQ-2 Score: 0  Alcohol Screen: Not on file  Housing: Not on file  Utilities: Not on file  Health Literacy: Not on file   Family History  Problem Relation Age of Onset   Hypertension Mother    Dementia Father    COPD Father    Heart disease Paternal Grandmother    Breast cancer Neg Hx    Allergies[2]   PE Today's Vitals   07/06/24 1253  BP: 104/76  Pulse: 82  Temp: 98.3 F (36.8 C)  TempSrc: Oral  SpO2: 98%  Weight: 157 lb (71.2 kg)  Height: 5' 6.75 (1.695 m)   Body mass index is 24.77 kg/m.  Physical Exam Vitals reviewed. Exam conducted with a chaperone present.  Constitutional:      General: She is not in acute distress.    Appearance: Normal appearance.  HENT:     Head: Normocephalic and atraumatic.     Nose: Nose normal.  Eyes:     Extraocular Movements: Extraocular movements intact.     Conjunctiva/sclera: Conjunctivae normal.  Pulmonary:     Effort: Pulmonary effort is normal.  Chest:     Chest wall: No mass or tenderness.  Breasts:    Right: Normal. No swelling, mass, nipple discharge, skin change or tenderness.     Left: Normal. No swelling, mass, nipple discharge, skin change or tenderness.  Abdominal:     General: There is no distension.     Palpations: Abdomen is soft.     Tenderness: There is no abdominal tenderness.  Genitourinary:    General: Normal vulva.     Exam position: Lithotomy position.     Urethra: No prolapse.     Vagina: Normal. No vaginal discharge or bleeding.     Cervix: Normal. No lesion.     Uterus: Normal. Not enlarged and not tender.      Adnexa: Right adnexa normal and left adnexa normal.  Musculoskeletal:        General: Normal range of motion.     Cervical back: Normal range of motion.   Lymphadenopathy:     Upper Body:     Right upper body: No axillary adenopathy.     Left upper body: No axillary adenopathy.     Lower Body: No right inguinal adenopathy. No left inguinal  adenopathy.  Skin:    General: Skin is warm and dry.  Neurological:     General: No focal deficit present.     Mental Status: She is alert.  Psychiatric:        Mood and Affect: Mood normal.        Behavior: Behavior normal.      Assessment and Plan:        Well woman exam with routine gynecological exam Assessment & Plan: Cervical cancer screening performed according to ASCCP guidelines. Encouraged annual mammogram screening  Labs and immunizations with her primary Encouraged safe sexual practices as indicated Encouraged healthy lifestyle practices with diet and exercise For patients under 50yo, I recommend 1000mg  calcium daily and 600IU of vitamin D daily.  Cervical cancer screening -     Cytology - PAP  Negative depression screening  Abnormal uterine bleeding (AUB) resolved  Vaginal discharge -     SureSwab Advanced Vaginitis Plus,TMA  Screen for STD (sexually transmitted disease) -     Hepatitis B surface antigen -     Hepatitis C antibody -     RPR W/RFLX TO RPR TITER, TREPONEMAL AB, SCREEN AND DIAGNOSIS -     HIV Antibody (routine testing w rflx) -     HSV 2 antibody, IgG -     SureSwab Advanced Vaginitis Plus,TMA  History of pyelonephritis -     Ambulatory referral to Urology -     Urinalysis,Complete w/RFL Culture  History of recurrent UTI (urinary tract infection) -     Ambulatory referral to Urology   Vera LULLA Pa, MD      [1]  Current Outpatient Medications on File Prior to Visit  Medication Sig Dispense Refill   acetaminophen  (TYLENOL ) 500 MG tablet Take 1 tablet (500 mg total) by mouth every 6 (six) hours as needed (pain). 30 tablet 0   albuterol  (VENTOLIN  HFA) 108 (90 Base) MCG/ACT inhaler Inhale 2 puffs into the lungs every 6 (six) hours as needed  for wheezing or shortness of breath. 16 g 1   buPROPion (WELLBUTRIN XL) 300 MG 24 hr tablet Take 300 mg by mouth daily.     clindamycin  (CLEOCIN -T) 1 % lotion Apply topically 2 (two) times daily. 60 mL 11   Flaxseed, Linseed, (FLAXSEED OIL PO) Take by mouth.     hydrOXYzine (VISTARIL) 25 MG capsule Take 25 mg by mouth daily.     ibuprofen  (ADVIL ) 600 MG tablet Take 1 tablet (600 mg total) by mouth every 6 (six) hours as needed. 30 tablet 0   metroNIDAZOLE  (METROGEL ) 0.75 % vaginal gel Place vaginally at bedtime.     ondansetron  (ZOFRAN -ODT) 4 MG disintegrating tablet Take 1 tablet (4 mg total) by mouth every 8 (eight) hours as needed for nausea or vomiting. 30 tablet 0   OVER THE COUNTER MEDICATION Take 1 capsule by mouth daily. chlorophyll pills     polyethylene glycol powder (GLYCOLAX /MIRALAX ) 17 GM/SCOOP powder Take 17 g by mouth daily. Drink 17g (1 scoop) dissolved in water per day. 255 g 0   Probiotic Product (PROBIOTIC PO) Take 1 capsule by mouth daily.     triamcinolone  cream (KENALOG ) 0.1 % Apply 1 Application topically 2 (two) times daily as needed (Rash). Until smooth. Avoid applying to face, groin, and axilla. Use as directed. Long-term use can cause thinning of the skin. 80 g 1   No current facility-administered medications on file prior to visit.  [2] No Known Allergies

## 2024-07-06 ENCOUNTER — Ambulatory Visit: Admitting: Obstetrics and Gynecology

## 2024-07-06 ENCOUNTER — Encounter: Payer: Self-pay | Admitting: Obstetrics and Gynecology

## 2024-07-06 ENCOUNTER — Other Ambulatory Visit (HOSPITAL_COMMUNITY)
Admission: RE | Admit: 2024-07-06 | Discharge: 2024-07-06 | Disposition: A | Discharge status: Home / Self Care | Source: Ambulatory Visit | Attending: Obstetrics and Gynecology | Admitting: Obstetrics and Gynecology

## 2024-07-06 VITALS — BP 104/76 | HR 82 | Temp 98.3°F | Ht 66.75 in | Wt 157.0 lb

## 2024-07-06 DIAGNOSIS — N898 Other specified noninflammatory disorders of vagina: Secondary | ICD-10-CM | POA: Diagnosis not present

## 2024-07-06 DIAGNOSIS — Z01419 Encounter for gynecological examination (general) (routine) without abnormal findings: Secondary | ICD-10-CM | POA: Insufficient documentation

## 2024-07-06 DIAGNOSIS — Z1331 Encounter for screening for depression: Secondary | ICD-10-CM

## 2024-07-06 DIAGNOSIS — Z87448 Personal history of other diseases of urinary system: Secondary | ICD-10-CM

## 2024-07-06 DIAGNOSIS — N939 Abnormal uterine and vaginal bleeding, unspecified: Secondary | ICD-10-CM

## 2024-07-06 DIAGNOSIS — Z124 Encounter for screening for malignant neoplasm of cervix: Secondary | ICD-10-CM

## 2024-07-06 DIAGNOSIS — Z113 Encounter for screening for infections with a predominantly sexual mode of transmission: Secondary | ICD-10-CM

## 2024-07-06 DIAGNOSIS — Z87440 Personal history of urinary (tract) infections: Secondary | ICD-10-CM

## 2024-07-06 NOTE — Assessment & Plan Note (Signed)
 Cervical cancer screening performed according to ASCCP guidelines. Encouraged annual mammogram screening Labs and immunizations with her primary Encouraged safe sexual practices as indicated Encouraged healthy lifestyle practices with diet and exercise For patients under 40yo, I recommend 1000mg  calcium daily and 600IU of vitamin D daily.

## 2024-07-06 NOTE — Patient Instructions (Addendum)
 You can use products like Lumi and acne body was to decrease body odor.  Health Maintenance, Female Adopting a healthy lifestyle and getting preventive care are important in promoting health and wellness. Ask your health care provider about: The right schedule for you to have regular tests and exams. Things you can do on your own to prevent diseases and keep yourself healthy. What should I know about diet, weight, and exercise? Eat a healthy diet  Eat a diet that includes plenty of vegetables, fruits, low-fat dairy products, and lean protein. Do not eat a lot of foods that are high in solid fats, added sugars, or sodium. Maintain a healthy weight Body mass index (BMI) is used to identify weight problems. It estimates body fat based on height and weight. Your health care provider can help determine your BMI and help you achieve or maintain a healthy weight. Get regular exercise Get regular exercise. This is one of the most important things you can do for your health. Most adults should: Exercise for at least 150 minutes each week. The exercise should increase your heart rate and make you sweat (moderate-intensity exercise). Do strengthening exercises at least twice a week. This is in addition to the moderate-intensity exercise. Spend less time sitting. Even light physical activity can be beneficial. Watch cholesterol and blood lipids Have your blood tested for lipids and cholesterol at 40 years of age, then have this test every 5 years. Have your cholesterol levels checked more often if: Your lipid or cholesterol levels are high. You are older than 40 years of age. You are at high risk for heart disease. What should I know about cancer screening? Depending on your health history and family history, you may need to have cancer screening at various ages. This may include screening for: Breast cancer. Cervical cancer. Colorectal cancer. Skin cancer. Lung cancer. What should I know about  heart disease, diabetes, and high blood pressure? Blood pressure and heart disease High blood pressure causes heart disease and increases the risk of stroke. This is more likely to develop in people who have high blood pressure readings or are overweight. Have your blood pressure checked: Every 3-5 years if you are 20-46 years of age. Every year if you are 64 years old or older. Diabetes Have regular diabetes screenings. This checks your fasting blood sugar level. Have the screening done: Once every three years after age 53 if you are at a normal weight and have a low risk for diabetes. More often and at a younger age if you are overweight or have a high risk for diabetes. What should I know about preventing infection? Hepatitis B If you have a higher risk for hepatitis B, you should be screened for this virus. Talk with your health care provider to find out if you are at risk for hepatitis B infection. Hepatitis C Testing is recommended for: Everyone born from [birth year] through 1967. Anyone with known risk factors for hepatitis C. Sexually transmitted infections (STIs) Get screened for STIs, including gonorrhea and chlamydia, if: You are sexually active and are younger than 40 years of age. You are older than 40 years of age and your health care provider tells you that you are at risk for this type of infection. Your sexual activity has changed since you were last screened, and you are at increased risk for chlamydia or gonorrhea. Ask your health care provider if you are at risk. Ask your health care provider about whether you are at high risk  for HIV. Your health care provider may recommend a prescription medicine to help prevent HIV infection. If you choose to take medicine to prevent HIV, you should first get tested for HIV. You should then be tested every 3 months for as long as you are taking the medicine. Pregnancy If you are about to stop having your period (premenopausal) and you may  become pregnant, seek counseling before you get pregnant. Take 400 to 800 micrograms (mcg) of folic acid every day if you become pregnant. Ask for birth control (contraception) if you want to prevent pregnancy. Osteoporosis and menopause Osteoporosis is a disease in which the bones lose minerals and strength with aging. This can result in bone fractures. If you are 4 years old or older, or if you are at risk for osteoporosis and fractures, ask your health care provider if you should: Be screened for bone loss. Take a calcium or vitamin D supplement to lower your risk of fractures. Be given hormone replacement therapy (HRT) to treat symptoms of menopause. Follow these instructions at home: Alcohol use Do not drink alcohol if: Your health care provider tells you not to drink. You are pregnant, may be pregnant, or are planning to become pregnant. If you drink alcohol: Limit how much you have to: 0-1 drink a day. Know how much alcohol is in your drink. In the [place] one drink equals one 12 oz bottle of beer (355 mL), one 5 oz glass of wine (148 mL), or one 1 oz glass of hard liquor (44 mL). Lifestyle Do not use any products that contain nicotine or tobacco. These products include cigarettes, chewing tobacco, and vaping devices, such as e-cigarettes. If you need help quitting, ask your health care provider. Do not use street drugs. Do not share needles. Ask your health care provider for help if you need support or information about quitting drugs. General instructions Schedule regular health, dental, and eye exams. Stay current with your vaccines. Tell your health care provider if: You often feel depressed. You have ever been abused or do not feel safe at home. Summary Adopting a healthy lifestyle and getting preventive care are important in promoting health and wellness. Follow your health care provider's instructions about healthy diet, exercising, and getting tested or screened for  diseases. Follow your health care provider's instructions on monitoring your cholesterol and blood pressure. This information is not intended to replace advice given to you by your health care provider. Make sure you discuss any questions you have with your health care provider. Document Revised: 11/25/2020 Document Reviewed: 11/25/2020 Elsevier Patient Education  2024 [employer].

## 2024-07-07 LAB — SURESWAB® ADVANCED VAGINITIS PLUS,TMA
C. trachomatis RNA, TMA: NOT DETECTED
CANDIDA SPECIES: NOT DETECTED
Candida glabrata: NOT DETECTED
N. gonorrhoeae RNA, TMA: NOT DETECTED
SURESWAB(R) ADV BACTERIAL VAGINOSIS(BV),TMA: NEGATIVE
TRICHOMONAS VAGINALIS (TV),TMA: NOT DETECTED

## 2024-07-07 LAB — URINALYSIS, COMPLETE W/RFL CULTURE
Bacteria, UA: NONE SEEN /HPF
Bilirubin Urine: NEGATIVE
Glucose, UA: NEGATIVE
Hgb urine dipstick: NEGATIVE
Hyaline Cast: NONE SEEN /LPF
Ketones, ur: NEGATIVE
Leukocyte Esterase: NEGATIVE
Nitrites, Initial: NEGATIVE
Protein, ur: NEGATIVE
RBC / HPF: NONE SEEN /HPF (ref 0–2)
Specific Gravity, Urine: 1.015 (ref 1.001–1.035)
Squamous Epithelial / HPF: NONE SEEN /HPF
WBC, UA: NONE SEEN /HPF (ref 0–5)
pH: 6 (ref 5.0–8.0)

## 2024-07-07 LAB — NO CULTURE INDICATED

## 2024-07-10 ENCOUNTER — Ambulatory Visit: Payer: Self-pay | Admitting: Obstetrics and Gynecology

## 2024-07-10 LAB — CYTOLOGY - PAP
Comment: NEGATIVE
Diagnosis: NEGATIVE
High risk HPV: NEGATIVE

## 2024-07-11 LAB — HSV 2 INHIBITION: HSV 2 IGG INHIBITION,IA: POSITIVE — AB

## 2024-07-11 LAB — HEPATITIS B SURFACE ANTIGEN: Hepatitis B Surface Ag: NONREACTIVE

## 2024-07-11 LAB — HIV ANTIBODY (ROUTINE TESTING W REFLEX)
HIV 1&2 Ab, 4th Generation: NONREACTIVE
HIV FINAL INTERPRETATION: NEGATIVE

## 2024-07-11 LAB — HEPATITIS C ANTIBODY: Hepatitis C Ab: NONREACTIVE

## 2024-07-11 LAB — SYPHILIS: RPR W/REFLEX TO RPR TITER AND TREPONEMAL ANTIBODIES, TRADITIONAL SCREENING AND DIAGNOSIS ALGORITHM: RPR Ser Ql: NONREACTIVE

## 2024-07-11 LAB — HERPES SIMPLEX VIRUS 2(IGG) W/ REFLEX TO HSV2 INHIBITION: HSV 2 Glycoprotein G Ab, IgG: 3.55 {index} — ABNORMAL HIGH

## 2024-12-19 ENCOUNTER — Ambulatory Visit: Admitting: Dermatology
